# Patient Record
Sex: Male | Born: 1960
Health system: Southern US, Community
[De-identification: ages and names within clinical notes are randomized; demographics above are authoritative.]

## PROBLEM LIST (undated history)

## (undated) DIAGNOSIS — L729 Follicular cyst of the skin and subcutaneous tissue, unspecified: Secondary | ICD-10-CM

## (undated) DIAGNOSIS — R739 Hyperglycemia, unspecified: Secondary | ICD-10-CM

## (undated) DIAGNOSIS — Z720 Tobacco use: Secondary | ICD-10-CM

## (undated) DIAGNOSIS — I10 Essential (primary) hypertension: Secondary | ICD-10-CM

## (undated) DIAGNOSIS — G4730 Sleep apnea, unspecified: Secondary | ICD-10-CM

## (undated) DIAGNOSIS — Z8249 Family history of ischemic heart disease and other diseases of the circulatory system: Secondary | ICD-10-CM

## (undated) DIAGNOSIS — R972 Elevated prostate specific antigen [PSA]: Secondary | ICD-10-CM

## (undated) DIAGNOSIS — N529 Male erectile dysfunction, unspecified: Secondary | ICD-10-CM

## (undated) DIAGNOSIS — G4733 Obstructive sleep apnea (adult) (pediatric): Secondary | ICD-10-CM

## (undated) DIAGNOSIS — G4701 Insomnia due to medical condition: Secondary | ICD-10-CM

## (undated) HISTORY — DX: Sleep apnea, unspecified: G47.30

---
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
## 1987-11-27 HISTORY — PX: WISDOM TOOTH EXTRACTION: SHX21

## 2000-04-18 ENCOUNTER — Ambulatory Visit (HOSPITAL_COMMUNITY): Admission: RE | Admit: 2000-04-18 | Discharge: 2000-04-18 | Payer: Self-pay | Admitting: *Deleted

## 2006-04-25 DIAGNOSIS — G4701 Insomnia due to medical condition: Secondary | ICD-10-CM | POA: Insufficient documentation

## 2006-04-25 DIAGNOSIS — N529 Male erectile dysfunction, unspecified: Secondary | ICD-10-CM

## 2006-04-25 HISTORY — DX: Insomnia due to medical condition: G47.01

## 2006-04-25 HISTORY — DX: Male erectile dysfunction, unspecified: N52.9

## 2007-02-25 DIAGNOSIS — Z9989 Dependence on other enabling machines and devices: Secondary | ICD-10-CM | POA: Insufficient documentation

## 2007-02-25 DIAGNOSIS — G4733 Obstructive sleep apnea (adult) (pediatric): Secondary | ICD-10-CM

## 2007-02-25 HISTORY — DX: Dependence on other enabling machines and devices: Z99.89

## 2007-02-25 HISTORY — DX: Obstructive sleep apnea (adult) (pediatric): G47.33

## 2008-05-18 DIAGNOSIS — Z8249 Family history of ischemic heart disease and other diseases of the circulatory system: Secondary | ICD-10-CM

## 2008-05-18 DIAGNOSIS — I10 Essential (primary) hypertension: Secondary | ICD-10-CM | POA: Insufficient documentation

## 2008-05-18 HISTORY — DX: Essential (primary) hypertension: I10

## 2008-05-18 HISTORY — DX: Family history of ischemic heart disease and other diseases of the circulatory system: Z82.49

## 2011-05-20 ENCOUNTER — Ambulatory Visit: Payer: Self-pay | Admitting: Internal Medicine

## 2014-06-26 HISTORY — PX: DENTAL SURGERY: SHX609

## 2015-03-30 LAB — BASIC METABOLIC PANEL
BUN: 19 mg/dL (ref 4–21)
Creatinine: 1.5 mg/dL — AB (ref 0.6–1.3)
Glucose: 131 mg/dL
Potassium: 4.2 mmol/L (ref 3.4–5.3)
Sodium: 138 mmol/L (ref 137–147)

## 2015-03-30 LAB — LIPID PANEL
Cholesterol: 183 mg/dL (ref 0–200)
HDL: 42 mg/dL (ref 35–70)
LDL Cholesterol: 93 mg/dL
Triglycerides: 240 mg/dL — AB (ref 40–160)

## 2015-03-30 LAB — PSA: PSA: 4.2

## 2015-03-31 ENCOUNTER — Encounter: Payer: Self-pay | Admitting: Family Medicine

## 2015-04-04 NOTE — Telephone Encounter (Signed)
error 

## 2015-04-27 ENCOUNTER — Telehealth: Payer: Self-pay | Admitting: Family Medicine

## 2015-04-27 NOTE — Telephone Encounter (Signed)
Jerry Walsh with [facility] called to request sleep study and progress notes.  [phone]/HK

## 2015-04-27 NOTE — Telephone Encounter (Signed)
Please send requested information.

## 2015-04-29 NOTE — Telephone Encounter (Signed)
Per Dr Caryn Section we can send requested info/MJ

## 2015-04-29 NOTE — Telephone Encounter (Signed)
Faxed to Diane @[phone]

## 2015-05-09 ENCOUNTER — Telehealth: Payer: Self-pay | Admitting: Family Medicine

## 2015-05-09 NOTE — Telephone Encounter (Signed)
Pt is asking if we have rec'd paper work from [employer] for Dr Caryn Section signature to approve new cpap supplies and equipment.  CB#[phone]/MJ

## 2015-05-09 NOTE — Telephone Encounter (Signed)
Please advise 

## 2015-05-09 NOTE — Telephone Encounter (Signed)
I don't see an order for this in the EMR. Please call [facility] and have them resend order request. Thanks.

## 2015-05-10 NOTE — Telephone Encounter (Signed)
[facility] closed at 3 pm. Will call back tomorrow. [phone]

## 2015-05-12 ENCOUNTER — Encounter: Payer: Self-pay | Admitting: Family Medicine

## 2015-05-12 ENCOUNTER — Ambulatory Visit (INDEPENDENT_AMBULATORY_CARE_PROVIDER_SITE_OTHER): Payer: 59 | Admitting: Family Medicine

## 2015-05-12 VITALS — BP 130/80 | HR 100 | Temp 98.9°F | Resp 18 | Wt 225.4 lb

## 2015-05-12 DIAGNOSIS — B349 Viral infection, unspecified: Secondary | ICD-10-CM

## 2015-05-12 LAB — POCT INFLUENZA A/B
Influenza A, POC: NEGATIVE
Influenza B, POC: NEGATIVE

## 2015-05-12 MED ORDER — OSELTAMIVIR PHOSPHATE 75 MG PO CAPS: 75.0000 mg | ORAL_CAPSULE | Freq: Two times a day (BID) | ORAL | Status: DC

## 2015-05-12 NOTE — Progress Notes (Signed)
   Subjective:    Patient ID: Jerry Walsh, male    DOB: 05-01-1961, 54 y.o.   MRN: [id number]  HPI Cough  Patient Active Problem List   Diagnosis Date Noted  . Essential (primary) hypertension 05/18/2008  . Fam hx-ischem heart disease 05/18/2008  . Obstructive apnea 02/25/2007  . Insomnia due to medical condition 04/25/2006  . ED (erectile dysfunction) of organic origin 04/25/2006   History  Substance Use Topics  . Smoking status: Current Every Day Smoker -- 1.00 packs/day for 15 years    Types: Cigarettes  . Smokeless tobacco: Not on file     Comment: QUIT IN 2008, AND STARTED BACK  . Alcohol Use: 1.8 oz/week    3 Standard drinks or equivalent per week     Comment: MODERATE USE   Past Surgical History  Procedure Laterality Date  . Wisdom tooth extraction  1989  . Dental surgery  06/2014   Family History  Problem Relation Age of Onset  . Diabetes Mother   . Congestive Heart Failure Mother   . Heart disease Father   . Coronary artery disease Father   . Diabetes Father    Current Outpatient Prescriptions on File Prior to Visit  Medication Sig Dispense Refill  . amLODipine (NORVASC) 5 MG tablet Take 1 tablet by mouth daily.    Marland Kitchen aspirin 81 MG tablet Take 1 tablet by mouth daily.    . tadalafil (CIALIS) 20 MG tablet Take 1 tablet by mouth as needed. NO MORE THAN 1 A DAY    . valsartan-hydrochlorothiazide (DIOVAN-HCT) 160-12.5 MG per tablet Take 1 tablet by mouth daily.     No current facility-administered medications on file prior to visit.   No Known Allergies   Review of Systems  Constitutional: Positive for chills and fatigue.  HENT: Positive for congestion. Negative for ear pain, postnasal drip, rhinorrhea and sore throat.   Eyes: Negative.   Respiratory: Positive for cough. Negative for chest tightness and wheezing.   Cardiovascular: Negative.   Gastrointestinal: Negative for nausea, vomiting and diarrhea.       Decreased appetite  Genitourinary:  Negative.   Musculoskeletal: Positive for myalgias, back pain and arthralgias.  Skin: Negative.        Objective:   Physical Exam  Constitutional: He is oriented to person, place, and time. He appears well-developed and well-nourished.  HENT:  Head: Normocephalic and atraumatic.  Right Ear: External ear normal.  Left Ear: External ear normal.  Slightly cobblestoned posterior pharynx and white nasal mucus.  Eyes: EOM are normal. Pupils are equal, round, and reactive to light.  Bilateral arcus senilis  Neck: Normal range of motion. Neck supple.  Cardiovascular: Normal rate, regular rhythm and normal heart sounds.   Pulmonary/Chest: Effort normal and breath sounds normal.  Abdominal: Soft. Bowel sounds are normal.  Musculoskeletal: Normal range of motion.  Neurological: He is alert and oriented to person, place, and time.  Skin: He is diaphoretic.   BP 130/80 mmHg  Pulse 100  Temp(Src) 98.9 F (37.2 C) (Oral)  Resp 18  Wt 225 lb 6.4 oz (102.241 kg)  SpO2 97%        Assessment & Plan:  1. Viral illness Onset of cough, headache, chills and general malaise with decreased appetite. Influenza test negative. Treat empirically with Tamiflu. May continue TheraFlu for cough and other symptoms. Increase fluid intake and recheck if no better in 5 - 7 days. - POCT Influenza A/B

## 2015-05-13 ENCOUNTER — Ambulatory Visit: Payer: Self-pay | Admitting: Family Medicine

## 2015-05-13 NOTE — Telephone Encounter (Signed)
LMOVM for cb. ?

## 2015-05-17 NOTE — Telephone Encounter (Signed)
Jennifer from [facility] to refax order.

## 2015-05-17 NOTE — Telephone Encounter (Signed)
Anderson Malta confirmed she received fax. sd

## 2015-05-17 NOTE — Telephone Encounter (Signed)
CPAP order faxed to Jerry Walsh fax # [phone]. LMTCB to make sure she received fax. sd

## 2015-05-18 ENCOUNTER — Ambulatory Visit
Admission: RE | Admit: 2015-05-18 | Discharge: 2015-05-18 | Disposition: A | Discharge status: Home / Self Care | Payer: 59 | Source: Ambulatory Visit | Attending: Family Medicine | Admitting: Family Medicine

## 2015-05-18 ENCOUNTER — Ambulatory Visit (INDEPENDENT_AMBULATORY_CARE_PROVIDER_SITE_OTHER): Payer: 59 | Admitting: Family Medicine

## 2015-05-18 ENCOUNTER — Encounter: Payer: Self-pay | Admitting: Family Medicine

## 2015-05-18 VITALS — BP 126/82 | HR 76 | Temp 97.9°F | Resp 16 | Wt 226.0 lb

## 2015-05-18 DIAGNOSIS — R05 Cough: Secondary | ICD-10-CM | POA: Diagnosis not present

## 2015-05-18 DIAGNOSIS — R059 Cough, unspecified: Secondary | ICD-10-CM

## 2015-05-18 DIAGNOSIS — J069 Acute upper respiratory infection, unspecified: Secondary | ICD-10-CM

## 2015-05-18 DIAGNOSIS — Z720 Tobacco use: Secondary | ICD-10-CM

## 2015-05-18 HISTORY — DX: Tobacco use: Z72.0

## 2015-05-18 MED ORDER — HYDROCODONE-HOMATROPINE 5-1.5 MG/5ML PO SYRP: 5.0000 mL | ORAL_SOLUTION | Freq: Three times a day (TID) | ORAL | Status: DC | PRN

## 2015-05-18 MED ORDER — BUPROPION HCL ER (SR) 150 MG PO TB12: ORAL_TABLET | ORAL | Status: DC

## 2015-05-18 NOTE — Progress Notes (Signed)
   Subjective:    Patient ID: Jerry Walsh, male    DOB: 06/17/1961, 54 y.o.   MRN: [id number]  URI  This is a recurrent problem. The problem has been unchanged. There has been no fever. Associated symptoms include congestion, coughing, headaches and rhinorrhea. Pertinent negatives include no abdominal pain, chest pain, diarrhea, ear pain, nausea, plugged ear sensation, sinus pain, sneezing, sore throat, vomiting or wheezing. The treatment provided no relief.   He was seen 05-12-15 with cough and flu symptoms and treated with Tamiflu which he has completed. He feels slightly improved today. No more fevers or chills. Will be traveling to [place] for the next 2 weeks.   Review of Systems  Constitutional: Positive for fatigue. Negative for fever, chills, diaphoresis, activity change, appetite change and unexpected weight change.  HENT: Positive for congestion and rhinorrhea. Negative for ear discharge, ear pain, facial swelling, hearing loss, mouth sores, nosebleeds, postnasal drip, sinus pressure, sneezing, sore throat, tinnitus, trouble swallowing and voice change.   Eyes: Negative for photophobia, pain, discharge, redness, itching and visual disturbance.  Respiratory: Positive for cough. Negative for apnea, choking, chest tightness, shortness of breath, wheezing and stridor.   Cardiovascular: Negative for chest pain, palpitations and leg swelling.  Gastrointestinal: Negative for nausea, vomiting, abdominal pain, diarrhea, constipation, blood in stool, abdominal distention, anal bleeding and rectal pain.  Neurological: Positive for headaches. Negative for dizziness and light-headedness.       Objective:   Physical Exam BP 126/82 mmHg  Pulse 76  Temp(Src) 97.9 F (36.6 C) (Oral)  Resp 16  Wt 226 lb (102.513 kg)  SpO2 98%   General Appearance:    Alert, cooperative, no distress  HENT:   ENT exam normal, no neck nodes or sinus tenderness  Eyes:    PERRL, conjunctiva/corneas clear, EOM's  intact       Lungs:     Mild diffuse expiratory wheezes, no rales respirations unlabored  Heart:    Regular rate and rhythm  Neurologic:   Awake, alert, oriented x 3. No apparent focal neurological           defect.           Assessment & Plan:  1. Upper respiratory infection   2. Cough  - DG Chest 2 View; Future - HYDROcodone-homatropine (HYCODAN) 5-1.5 MG/5ML syrup; Take 5 mLs by mouth every 8 (eight) hours as needed for cough.  Dispense: 120 mL; Refill: 0  3. Tobacco abuse  - buPROPion (WELLBUTRIN SR) 150 MG 12 hr tablet; Daily for 3 days, then twice daily. Stop smoking after being on medication  Dispense: 60 tablet; Refill: 3

## 2015-05-19 ENCOUNTER — Telehealth: Payer: Self-pay | Admitting: *Deleted

## 2015-05-19 ENCOUNTER — Telehealth: Payer: Self-pay | Admitting: Family Medicine

## 2015-05-19 MED ORDER — LEVOFLOXACIN 500 MG PO TABS: 500.0000 mg | ORAL_TABLET | Freq: Every day | ORAL | Status: DC

## 2015-05-19 NOTE — Telephone Encounter (Signed)
Pt is requesting the results of a chest x-ray.  CB#[phone]/MJ

## 2015-05-19 NOTE — Telephone Encounter (Signed)
Patient notified of results.

## 2015-05-19 NOTE — Telephone Encounter (Signed)
-----   Message from Birdie Sons, MD sent at 05/18/2015  2:57 PM EDT ----- Please advise patient that there is no sign of pneumonia on chest xr, but i think he does have bronchitis. Please send in rx for levofloxacin 500mg  one tablet daily for 10 days. He should be fine to travel.

## 2015-05-19 NOTE — Telephone Encounter (Signed)
Rx sent to pharmacy   

## 2015-06-20 ENCOUNTER — Other Ambulatory Visit: Payer: Self-pay | Admitting: Family Medicine

## 2015-06-20 MED ORDER — VALSARTAN-HYDROCHLOROTHIAZIDE 160-12.5 MG PO TABS: 1.0000 | ORAL_TABLET | Freq: Every day | ORAL | Status: DC

## 2015-06-20 MED ORDER — AMLODIPINE BESYLATE 5 MG PO TABS: 5.0000 mg | ORAL_TABLET | Freq: Every day | ORAL | Status: DC

## 2015-06-20 NOTE — Telephone Encounter (Signed)
Pt contacted office for refill request on the following medications: amLODipine (NORVASC) 5 MG tablet & valsartan-hydrochlorothiazide (DIOVAN-HCT) 160-12.5 MG per tablet to [facility]. Pt stated that his mail order is sending the medication to him but he needs 10 days sent to local pharmacy to last until mail order comes in. I asked pt if we also needed to send a refill to mail order because we haven't refilled it since 11/20/14. Pt stated that his RX is still good and he has already request they send the medication. Pt stated that this was kind of an emergency because he was out of the medication. I advised that refills can take 24 to 48 hours. Thanks TNP

## 2015-06-20 NOTE — Telephone Encounter (Signed)
Pt informed, bb

## 2015-06-20 NOTE — Telephone Encounter (Signed)
Have sent 10 day supply valsartan-hct and amlodipine to [facility].

## 2015-07-27 ENCOUNTER — Ambulatory Visit: Payer: 59 | Admitting: Family Medicine

## 2015-07-27 ENCOUNTER — Telehealth: Payer: Self-pay | Admitting: *Deleted

## 2015-07-27 NOTE — Telephone Encounter (Signed)
Left message on pt's vm to reschedule appt.

## 2015-08-10 ENCOUNTER — Encounter: Payer: Self-pay | Admitting: Family Medicine

## 2015-08-10 ENCOUNTER — Ambulatory Visit (INDEPENDENT_AMBULATORY_CARE_PROVIDER_SITE_OTHER): Payer: 59 | Admitting: Family Medicine

## 2015-08-10 VITALS — BP 126/78 | HR 74 | Temp 98.6°F | Resp 16 | Ht 69.0 in | Wt 229.0 lb

## 2015-08-10 DIAGNOSIS — M779 Enthesopathy, unspecified: Secondary | ICD-10-CM

## 2015-08-10 DIAGNOSIS — R972 Elevated prostate specific antigen [PSA]: Secondary | ICD-10-CM

## 2015-08-10 DIAGNOSIS — M778 Other enthesopathies, not elsewhere classified: Secondary | ICD-10-CM

## 2015-08-10 DIAGNOSIS — M6588 Other synovitis and tenosynovitis, other site: Secondary | ICD-10-CM | POA: Diagnosis not present

## 2015-08-10 DIAGNOSIS — Z720 Tobacco use: Secondary | ICD-10-CM | POA: Diagnosis not present

## 2015-08-10 DIAGNOSIS — R739 Hyperglycemia, unspecified: Secondary | ICD-10-CM

## 2015-08-10 HISTORY — DX: Elevated prostate specific antigen (PSA): R97.20

## 2015-08-10 HISTORY — DX: Hyperglycemia, unspecified: R73.9

## 2015-08-10 MED ORDER — VARENICLINE TARTRATE 1 MG PO TABS: 1.0000 mg | ORAL_TABLET | Freq: Two times a day (BID) | ORAL | Status: DC

## 2015-08-10 MED ORDER — VARENICLINE TARTRATE 0.5 MG X 11 & 1 MG X 42 PO MISC: ORAL | Status: DC

## 2015-08-10 NOTE — Progress Notes (Signed)
Patient: Jerry Walsh Male    DOB: 12-02-1960   54 y.o.   MRN: [id number] Visit Date: 08/10/2015  Today's Provider: [name], MD   Chief Complaint  Patient presents with  . Hypertension    follow up  . Sleep Apnea    follow up  . Hyperglycemia    follow up  . Elevated PSA    follow up   Subjective:    HPI  Hypertension, follow-up:  BP Readings from Last 3 Encounters:  08/10/15 126/78  05/18/15 126/82  05/12/15 130/80    He was last seen for hypertension 4 months ago.  BP at that visit was  126/82. Management since that visit includes  none. He reports good compliance with treatment. He is not having side effects.  He is exercising. He is not adherent to low salt diet.   Outside blood pressures are not being checked. He is experiencing none.  Patient denies chest pain, chest pressure/discomfort, claudication, dyspnea, exertional chest pressure/discomfort, fatigue, irregular heart beat, lower extremity edema, near-syncope, orthopnea, palpitations, paroxysmal nocturnal dyspnea, syncope and tachypnea.   Cardiovascular risk factors include hypertension, male gender and smoking/ tobacco exposure.  Use of agents associated with hypertension: NSAIDS.     Weight trend: increasing steadily Wt Readings from Last 3 Encounters:  08/10/15 229 lb (103.874 kg)  05/18/15 226 lb (102.513 kg)  05/12/15 225 lb 6.4 oz (102.241 kg)    Current diet: in general, an "unhealthy" diet  ------------------------------------------------------------------------   Hyperglycemia, Follow-up:   No results found for: HGBA1C, GLUCOSE  Last seen for for this 4 months ago.  Management since then includes  Advising patient to cut back on sweets and starchy foods. Current symptoms include none and have been stable.  Weight trend: increasing steadily Prior visit with dietician: no Current diet: in general, an "unhealthy" diet Current exercise: none  Pertinent Labs:      Component Value Date/Time   CHOL 183 03/30/2015   TRIG 240* 03/30/2015   CREATININE 1.5* 03/30/2015    Wt Readings from Last 3 Encounters:  08/10/15 229 lb (103.874 kg)  05/18/15 226 lb (102.513 kg)  05/12/15 225 lb 6.4 oz (102.241 kg)       Follow up Elevated PSA: Last office visit was 4 months ago. Labs were checked showing his PSA was elevated 4.2.    Follow up on Obstructive Apnea: Last office visit was 4 months ago when new CPAP supplies were ordered. He states he has had  New supplies for about 6 weeks and is doing very well. He is using CPAP every night and has been much less fatigued and not feeling drowsy or falling asleep during the day. He feels current setting are making a big improvement and wishes to continue unchanged.   Smoking Cessation Was prescribed bproprion earlier this year which he took for about a month. He states he really felt his cravings go away, but developed severe itching and some whelps after he had taken medication for a couple of weeks. Itching resolved after stopping medication, but craving to smoke have returned and he wishes to try another medication.    Allergies  Allergen Reactions  . Bupropion Itching   Previous Medications   AMLODIPINE (NORVASC) 5 MG TABLET    Take 1 tablet (5 mg total) by mouth daily.   ASPIRIN 81 MG TABLET    Take 1 tablet by mouth daily.   IBUPROFEN (ADVIL,MOTRIN) 200 MG TABLET    Take  200 mg by mouth every 6 (six) hours as needed.   TADALAFIL (CIALIS) 20 MG TABLET    Take 1 tablet by mouth as needed. NO MORE THAN 1 A DAY   VALSARTAN-HYDROCHLOROTHIAZIDE (DIOVAN-HCT) 160-12.5 MG PER TABLET    Take 1 tablet by mouth daily.    Review of Systems  Constitutional: Negative for fever, chills and appetite change.  Respiratory: Negative for chest tightness, shortness of breath and wheezing.   Cardiovascular: Negative for chest pain and palpitations.  Gastrointestinal: Negative for nausea, vomiting and abdominal pain.   Musculoskeletal: Positive for arthralgias (right thumb ).    Social History  Substance Use Topics  . Smoking status: Current Every Day Smoker -- 1.00 packs/day for 15 years    Types: Cigarettes  . Smokeless tobacco: Not on file     Comment: QUIT IN 2008, AND STARTED BACK  . Alcohol Use: 1.8 oz/week    3 Standard drinks or equivalent per week     Comment: MODERATE USE   Objective:   BP 126/78 mmHg  Pulse 74  Temp(Src) 98.6 F (37 C) (Oral)  Resp 16  Ht 5\' 9"  (1.753 m)  Wt 229 lb (103.874 kg)  BMI 33.80 kg/m2  SpO2 97%  Physical Exam  General appearance: alert, well developed, well nourished, cooperative and in no distress Head: Normocephalic, without obvious abnormality, atraumatic Lungs: Respirations even and unlabored Extremities: No gross deformities Skin: Skin color, texture, turgor normal. No rashes seen  Psych: Appropriate mood and affect. Neurologic: Mental status: Alert, oriented to person, place, and time, thought content appropriate. MS: Pain with active flexion of right thumb, slightly tender over flexors. Mildly swollen.     Assessment & Plan:     1. Elevated PSA  - PSA  2. Hyperglycemia  - Hemoglobin A1c  3. Tobacco abuse Had to stop bupropion due to itching.  - varenicline (CHANTIX STARTING MONTH PAK) 0.5 MG X 11 & 1 MG X 42 tablet; Take one 0.5 mg tablet by mouth once daily for 3 days, then increase to one 0.5 mg tablet twice daily for 4 days, then increase to one 1 mg tablet twice daily.  Dispense: 53 tablet; Refill: 0 - varenicline (CHANTIX CONTINUING MONTH PAK) 1 MG tablet; Take 1 tablet (1 mg total) by mouth 2 (two) times daily.  Dispense: 60 tablet; Refill: 3  4. Thumb tendonitis He did notice some improvement when he starting using thumb brace.   Patient Instructions  Continue using thumb brace. Call for orthopedic referral if not better in 2 weeks.         [name], MD  [facility]

## 2015-08-10 NOTE — Patient Instructions (Signed)
Continue using thumb brace. Call for orthopedic referral if not better in 2 weeks.

## 2015-11-22 ENCOUNTER — Other Ambulatory Visit: Payer: Self-pay | Admitting: Family Medicine

## 2016-02-08 ENCOUNTER — Other Ambulatory Visit: Payer: Self-pay | Admitting: Family Medicine

## 2016-04-12 ENCOUNTER — Other Ambulatory Visit: Payer: Self-pay | Admitting: Family Medicine

## 2016-11-05 ENCOUNTER — Other Ambulatory Visit: Payer: Self-pay | Admitting: Family Medicine

## 2016-11-05 DIAGNOSIS — I10 Essential (primary) hypertension: Secondary | ICD-10-CM

## 2016-11-05 MED ORDER — AMLODIPINE BESYLATE 5 MG PO TABS: 5.0000 mg | ORAL_TABLET | Freq: Every day | ORAL | 0 refills | Status: DC

## 2016-11-05 MED ORDER — VALSARTAN-HYDROCHLOROTHIAZIDE 160-12.5 MG PO TABS: 1.0000 | ORAL_TABLET | Freq: Every day | ORAL | 0 refills | Status: DC

## 2016-11-05 NOTE — Telephone Encounter (Signed)
I spoke with patient and advised him that the number he gave was the number to [facility] office. Patient provided me with another number for [facility] pharmacy in [place]:  [phone]. I called this number and was on hold for over 5 minutes and was then told by the pharmacy tech that the pharmacist was helping a patient and there would be a bit of a wait before he could get to the phone. I then phoned prescriptions in leaving a detailed message on the Pharmacist voice message system. I called patient and advised him of this.

## 2016-11-05 NOTE — Telephone Encounter (Signed)
Tried calling patient to confirm the pharmacy. Tried calling into the pharmacy listed below, and the representative  reports that they dont have a pharmacy at that location. She reports that they do have one on [address] in [place], [place] and gave me a number to call, but I wanted to make sure that this is the correct pharmacy before calling them in. Left message for the patient to call back.  The number for the pharmacy is [phone]. Thanks.

## 2016-11-05 NOTE — Telephone Encounter (Signed)
Please advise 

## 2016-11-05 NOTE — Telephone Encounter (Signed)
Pt is calling to see when the Rx will be called in.  CB# [phone]

## 2016-11-05 NOTE — Telephone Encounter (Signed)
Pt called from [place] needing a refill on his   amLODipine (NORVASC) 5 MG tablet  valsartan-hydrochlorothiazide (DIOVAN-HCT) 160-12.5 MG tablet  The name of the pharmacy is  [facility] [address] [place] [address]  [phone]  He [place] and [place] his meds.  Just needs 1 week.  His call back [phone]  Thanks Con Memos

## 2016-11-05 NOTE — Telephone Encounter (Signed)
OK to send or call into pharmacy.

## 2017-01-03 DIAGNOSIS — M545 Low back pain: Secondary | ICD-10-CM | POA: Diagnosis not present

## 2017-03-26 ENCOUNTER — Other Ambulatory Visit: Payer: Self-pay | Admitting: Family Medicine

## 2017-03-26 DIAGNOSIS — I10 Essential (primary) hypertension: Secondary | ICD-10-CM

## 2017-05-01 ENCOUNTER — Ambulatory Visit (INDEPENDENT_AMBULATORY_CARE_PROVIDER_SITE_OTHER): Payer: 59 | Admitting: Family Medicine

## 2017-05-01 ENCOUNTER — Encounter: Payer: Self-pay | Admitting: Family Medicine

## 2017-05-01 VITALS — BP 110/64 | HR 78 | Temp 98.1°F | Resp 16 | Ht 69.0 in | Wt 231.0 lb

## 2017-05-01 DIAGNOSIS — Z0000 Encounter for general adult medical examination without abnormal findings: Secondary | ICD-10-CM | POA: Diagnosis not present

## 2017-05-01 DIAGNOSIS — Z720 Tobacco use: Secondary | ICD-10-CM

## 2017-05-01 DIAGNOSIS — L729 Follicular cyst of the skin and subcutaneous tissue, unspecified: Secondary | ICD-10-CM | POA: Diagnosis not present

## 2017-05-01 DIAGNOSIS — I10 Essential (primary) hypertension: Secondary | ICD-10-CM | POA: Diagnosis not present

## 2017-05-01 DIAGNOSIS — Z1159 Encounter for screening for other viral diseases: Secondary | ICD-10-CM | POA: Diagnosis not present

## 2017-05-01 DIAGNOSIS — Z1211 Encounter for screening for malignant neoplasm of colon: Secondary | ICD-10-CM

## 2017-05-01 DIAGNOSIS — Z9989 Dependence on other enabling machines and devices: Secondary | ICD-10-CM

## 2017-05-01 DIAGNOSIS — Z125 Encounter for screening for malignant neoplasm of prostate: Secondary | ICD-10-CM | POA: Diagnosis not present

## 2017-05-01 DIAGNOSIS — R739 Hyperglycemia, unspecified: Secondary | ICD-10-CM | POA: Diagnosis not present

## 2017-05-01 DIAGNOSIS — G4733 Obstructive sleep apnea (adult) (pediatric): Secondary | ICD-10-CM | POA: Diagnosis not present

## 2017-05-01 HISTORY — DX: Follicular cyst of the skin and subcutaneous tissue, unspecified: L72.9

## 2017-05-01 NOTE — Patient Instructions (Signed)
Steps to Quit Smoking Smoking tobacco can be bad for your health. It can also affect almost every organ in your body. Smoking puts you and people around you at risk for many serious long-lasting (chronic) diseases. Quitting smoking is hard, but it is one of the best things that you can do for your health. It is never too late to quit. What are the benefits of quitting smoking? When you quit smoking, you lower your risk for getting serious diseases and conditions. They can include:  Lung cancer or lung disease.  Heart disease.  Stroke.  Heart attack.  Not being able to have children (infertility).  Weak bones (osteoporosis) and broken bones (fractures).  If you have coughing, wheezing, and shortness of breath, those symptoms may get better when you quit. You may also get sick less often. If you are pregnant, quitting smoking can help to lower your chances of having a baby of low birth weight. What can I do to help me quit smoking? Talk with your doctor about what can help you quit smoking. Some things you can do (strategies) include:  Quitting smoking totally, instead of slowly cutting back how much you smoke over a period of time.  Going to in-person counseling. You are more likely to quit if you go to many counseling sessions.  Using resources and support systems, such as: ? Online chats with a counselor. ? Phone quitlines. ? Printed self-help materials. ? Support groups or group counseling. ? Text messaging programs. ? Mobile phone apps or applications.  Taking medicines. Some of these medicines may have nicotine in them. If you are pregnant or breastfeeding, do not take any medicines to quit smoking unless your doctor says it is okay. Talk with your doctor about counseling or other things that can help you.  Talk with your doctor about using more than one strategy at the same time, such as taking medicines while you are also going to in-person counseling. This can help make  quitting easier. What things can I do to make it easier to quit? Quitting smoking might feel very hard at first, but there is a lot that you can do to make it easier. Take these steps:  Talk to your family and friends. Ask them to support and encourage you.  Call phone quitlines, reach out to support groups, or work with a counselor.  Ask people who smoke to not smoke around you.  Avoid places that make you want (trigger) to smoke, such as: ? Bars. ? Parties. ? Smoke-break areas at work.  Spend time with people who do not smoke.  Lower the stress in your life. Stress can make you want to smoke. Try these things to help your stress: ? Getting regular exercise. ? Deep-breathing exercises. ? Yoga. ? Meditating. ? Doing a body scan. To do this, close your eyes, focus on one area of your body at a time from head to toe, and notice which parts of your body are tense. Try to relax the muscles in those areas.  Download or buy apps on your mobile phone or tablet that can help you stick to your quit plan. There are many free apps, such as QuitGuide from the CDC (Centers for Disease Control and Prevention). You can find more support from smokefree.gov and other websites.  This information is not intended to replace advice given to you by your health care provider. Make sure you discuss any questions you have with your health care provider. Document Released: 09/08/2009 Document   Revised: 07/10/2016 Document Reviewed: 03/29/2015 Elsevier Interactive Patient Education  2018 [employer]vier Inc.  

## 2017-05-01 NOTE — Progress Notes (Signed)
Patient: Jerry Walsh, Male    DOB: 12-05-1960, 56 y.o.   MRN: [id number] Visit Date: 05/01/2017  Today's Provider: [name], MD   Chief Complaint  Patient presents with  . Annual Exam  . Hypertension   Subjective:    Annual physical exam JOFFREY Walsh is a 56 y.o. male who presents today for health maintenance and complete physical. He feels well. He reports exercising yes/some. He reports he is sleeping well.  Colonoscopy-  Tdap- 05/18/2008.    Hypertension, follow-up:  BP Readings from Last 3 Encounters:  05/01/17 110/64  08/10/15 126/78  05/18/15 126/82    He was last seen for hypertension 2 years ago.  BP at that visit was 126/82. Management since that visit includes no changes. He reports good compliance with treatment. He is not having side effects. none He is exercising. He is adherent to low salt diet.   Outside blood pressures are not checking. He is experiencing none.  Patient denies none.   Cardiovascular risk factors include none.  Use of agents associated with hypertension: none.     Weight trend: stable Wt Readings from Last 3 Encounters:  05/01/17 231 lb (104.8 kg)  08/10/15 229 lb (103.9 kg)  05/18/15 226 lb (102.5 kg)    Current diet: well balanced    Hyperglycemia, Follow-up:   No results found for: HGBA1C, GLUCOSE  Last seen for for this 2 years ago.  Management since then includes no changes. Current symptoms include none and have been unchanged.  Weight trend: stable Prior visit with dietician: no Current diet: well balanced Current exercise: gym  Pertinent Labs:    Component Value Date/Time   CHOL 183 03/30/2015   TRIG 240 (A) 03/30/2015   CREATININE 1.5 (A) 03/30/2015    Wt Readings from Last 3 Encounters:  05/01/17 231 lb (104.8 kg)  08/10/15 229 lb (103.9 kg)  05/18/15 226 lb (102.5 kg)    Is using CPAP most nights and notices big difference in energy level and concentration when he uses  it. Is well tolerating.   Review of Systems  Constitutional: Negative.   HENT: Positive for postnasal drip and sneezing.   Eyes: Negative.   Respiratory: Positive for apnea.   Cardiovascular: Negative.   Gastrointestinal: Negative.   Endocrine: Negative.   Genitourinary: Negative.   Musculoskeletal: Positive for back pain.  Skin: Negative.   Allergic/Immunologic: Negative.   Neurological: Negative.   Hematological: Negative.   Psychiatric/Behavioral: Negative.     Social History      He  reports that he has been smoking Cigarettes.  He has a 15.00 pack-year smoking history. He uses smokeless tobacco. He reports that he drinks about 1.8 oz of alcohol per week . He reports that he does not use drugs.       Social History   Social History  . Marital status: Married    Spouse name: N/A  . Number of children: N/A  . Years of education: N/A   Social History Main Topics  . Smoking status: Current Every Day Smoker    Packs/day: 1.00    Years: 15.00    Types: Cigarettes  . Smokeless tobacco: Current User     Comment: QUIT IN 2008, AND STARTED BACK  . Alcohol use 1.8 oz/week    3 Standard drinks or equivalent per week     Comment: MODERATE USE  . Drug use: No  . Sexual activity: Not Asked   Other Topics  Concern  . None   Social History Narrative  . None    History reviewed. No pertinent past medical history.   Patient Active Problem List   Diagnosis Date Noted  . Elevated PSA 08/10/2015  . Hyperglycemia 08/10/2015  . Tobacco abuse 05/18/2015  . Essential (primary) hypertension 05/18/2008  . Fam hx-ischem heart disease 05/18/2008  . Obstructive apnea 02/25/2007  . Insomnia due to medical condition 04/25/2006  . ED (erectile dysfunction) of organic origin 04/25/2006    Past Surgical History:  Procedure Laterality Date  . DENTAL SURGERY  06/2014  . WISDOM TOOTH EXTRACTION  1989    Family History        Family Status  Relation Status  . Mother Alive  .  Father Alive  . Brother Alive  . Brother Deceased        His family history includes Congestive Heart Failure in his mother; Coronary artery disease in his father; Diabetes in his father and mother; Heart disease in his father.     Allergies  Allergen Reactions  . Bupropion Itching     Current Outpatient Prescriptions:  .  amLODipine (NORVASC) 5 MG tablet, TAKE 1 TABLET BY MOUTH  DAILY, Disp: 90 tablet, Rfl: 0 .  aspirin 81 MG tablet, Take 1 tablet by mouth daily., Disp: , Rfl:  .  CIALIS 20 MG tablet, Take 1 tablet by mouth as  needed no more than 1  tablet in a day, Disp: 9 tablet, Rfl: 5 .  ibuprofen (ADVIL,MOTRIN) 200 MG tablet, Take 200 mg by mouth every 6 (six) hours as needed., Disp: , Rfl:  .  valsartan-hydrochlorothiazide (DIOVAN-HCT) 160-12.5 MG tablet, TAKE 1 TABLET BY MOUTH  DAILY, Disp: 90 tablet, Rfl: 0   Patient Care Team: Birdie Sons, MD as PCP - General (Family Medicine)      Objective:   Vitals: BP 110/64 (BP Location: Right Arm, Patient Position: Sitting, Cuff Size: Large)   Pulse 78   Temp 98.1 F (36.7 C) (Oral)   Resp 16   Ht 5\' 9"  (1.753 m)   Wt 231 lb (104.8 kg)   SpO2 97%   BMI 34.11 kg/m    Vitals:   05/01/17 0912  BP: 110/64  Pulse: 78  Resp: 16  Temp: 98.1 F (36.7 C)  TempSrc: Oral  SpO2: 97%  Weight: 231 lb (104.8 kg)  Height: 5\' 9"  (1.753 m)     Physical Exam   General Appearance:    Alert, cooperative, no distress, appears stated age  Head:    Normocephalic, without obvious abnormality, atraumatic  Eyes:    PERRL, conjunctiva/corneas clear, EOM's intact, fundi    benign, both eyes       Ears:    Normal TM's and external ear canals, both ears  Nose:   Nares normal, septum midline, mucosa normal, no drainage   or sinus tenderness  Throat:   Lips, mucosa, and tongue normal; teeth and gums normal  Neck:   Supple, symmetrical, trachea midline, no adenopathy;       thyroid:  No enlargement/tenderness/nodules; no carotid    bruit or JVD  Back:     Symmetric, no curvature, ROM normal, no CVA tenderness  Lungs:     Clear to auscultation bilaterally, respirations unlabored  Chest wall:    No tenderness or deformity  Heart:    Regular rate and rhythm, S1 and S2 normal, no murmur, rub   or gallop  Abdomen:     Soft, non-tender,  bowel sounds active all four quadrants,    no masses, no organomegaly  Genitalia:    deferred  Rectal:    deferred  Extremities:   Extremities normal, atraumatic, no cyanosis or edema  Pulses:   2+ and symmetric all extremities  Skin:   Skin color, texture, turgor normal, no rashes. Firm slightly tender pea sized nodule vertex of scalp left of midline. No erythema.   Lymph nodes:   Cervical, supraclavicular, and axillary nodes normal  Neurologic:   CNII-XII intact. Normal strength, sensation and reflexes      throughout    Depression Screen PHQ 2/9 Scores 05/01/2017  PHQ - 2 Score 0  PHQ- 9 Score 0      Assessment & Plan:     Routine Health Maintenance and Physical Exam  Exercise Activities and Dietary recommendations Goals    None      Immunization History  Administered Date(s) Administered  . Tdap 05/18/2008, 07/04/2010    Health Maintenance  Topic Date Due  . Hepatitis C Screening  [birth year]  . HIV Screening  04/10/1975  . COLONOSCOPY  04/09/2010  . INFLUENZA VACCINE  06/26/2017  . TETANUS/TDAP  07/04/2020     Discussed health benefits of physical activity, and encouraged him to engage in regular exercise appropriate for his age and condition.    -------------------------------------------------------------------- 1. Annual physical exam Generally doing well.  - Comprehensive metabolic panel - Lipid panel  2. Essential (primary) hypertension Well controlled.  Continue current medications.   - EKG 12-Lead  3. OSA on CPAP Consistently using CPAP with significant benefit.   4. Tobacco abuse Has quit off and on over the years and has not accumulated 30  pack years of cigarette use. Counseled benefits of smoking cessation.   5. Prostate cancer screening  - PSA  6. Need for hepatitis C screening test  - Hepatitis C antibody  7. Colon cancer screening  - Ambulatory referral to Gastroenterology  8. Hyperglycemia  - Hemoglobin A1c  9. Scalp cyst  - Ambulatory referral to Dermatology    [name], MD  [facility]

## 2017-05-02 LAB — COMPREHENSIVE METABOLIC PANEL
ALT: 27 IU/L (ref 0–44)
AST: 24 IU/L (ref 0–40)
Albumin/Globulin Ratio: 1.8 (ref 1.2–2.2)
Albumin: 4.4 g/dL (ref 3.5–5.5)
Alkaline Phosphatase: 73 IU/L (ref 39–117)
BUN/Creatinine Ratio: 13 (ref 9–20)
BUN: 17 mg/dL (ref 6–24)
Bilirubin Total: 0.7 mg/dL (ref 0.0–1.2)
CO2: 26 mmol/L (ref 18–29)
Calcium: 9.7 mg/dL (ref 8.7–10.2)
Chloride: 100 mmol/L (ref 96–106)
Creatinine, Ser: 1.34 mg/dL — ABNORMAL HIGH (ref 0.76–1.27)
GFR calc Af Amer: 68 mL/min/{1.73_m2} (ref >59)
GFR calc non Af Amer: 58 mL/min/{1.73_m2} — ABNORMAL LOW (ref >59)
Globulin, Total: 2.5 g/dL (ref 1.5–4.5)
Glucose: 118 mg/dL — ABNORMAL HIGH (ref 65–99)
Potassium: 4.1 mmol/L (ref 3.5–5.2)
Sodium: 140 mmol/L (ref 134–144)
Total Protein: 6.9 g/dL (ref 6.0–8.5)

## 2017-05-02 LAB — LIPID PANEL
Chol/HDL Ratio: 3.9 ratio (ref 0.0–5.0)
Cholesterol, Total: 185 mg/dL (ref 100–199)
HDL: 47 mg/dL (ref >39)
LDL Calculated: 108 mg/dL — ABNORMAL HIGH (ref 0–99)
Triglycerides: 150 mg/dL — ABNORMAL HIGH (ref 0–149)
VLDL Cholesterol Cal: 30 mg/dL (ref 5–40)

## 2017-05-02 LAB — HEMOGLOBIN A1C
Est. average glucose Bld gHb Est-mCnc: 123 mg/dL
Hgb A1c MFr Bld: 5.9 % — ABNORMAL HIGH (ref 4.8–5.6)

## 2017-05-02 LAB — PSA: Prostate Specific Ag, Serum: 3.2 ng/mL (ref 0.0–4.0)

## 2017-05-02 LAB — HEPATITIS C ANTIBODY: Hep C Virus Ab: <0.1 s/co ratio (ref 0.0–0.9)

## 2017-05-08 DIAGNOSIS — L720 Epidermal cyst: Secondary | ICD-10-CM | POA: Diagnosis not present

## 2017-05-09 DIAGNOSIS — L729 Follicular cyst of the skin and subcutaneous tissue, unspecified: Secondary | ICD-10-CM | POA: Diagnosis not present

## 2017-05-09 DIAGNOSIS — L720 Epidermal cyst: Secondary | ICD-10-CM | POA: Diagnosis not present

## 2017-05-21 ENCOUNTER — Other Ambulatory Visit: Payer: Self-pay

## 2017-05-21 ENCOUNTER — Telehealth: Payer: Self-pay

## 2017-05-21 DIAGNOSIS — Z1211 Encounter for screening for malignant neoplasm of colon: Secondary | ICD-10-CM

## 2017-05-21 NOTE — Telephone Encounter (Signed)
Gastroenterology Pre-Procedure Review  Request Date:  Requesting Physician: Dr.   PATIENT REVIEW QUESTIONS: The patient responded to the following health history questions as indicated:    1. Are you having any GI issues? no 2. Do you have a personal history of Polyps? no 3. Do you have a family history of Colon Cancer or Polyps? no 4. Diabetes Mellitus? no 5. Joint replacements in the past 12 months?no 6. Major health problems in the past 3 months?no 7. Any artificial heart valves, MVP, or defibrillator?no    MEDICATIONS & ALLERGIES:    Patient reports the following regarding taking any anticoagulation/antiplatelet therapy:   Plavix, Coumadin, Eliquis, Xarelto, Lovenox, Pradaxa, Brilinta, or Effient? no Aspirin? yes (ASA 81mg )  Patient confirms/reports the following medications:  Current Outpatient Prescriptions  Medication Sig Dispense Refill  . amLODipine (NORVASC) 5 MG tablet TAKE 1 TABLET BY MOUTH  DAILY 90 tablet 0  . aspirin 81 MG tablet Take 1 tablet by mouth daily.    Marland Kitchen CIALIS 20 MG tablet Take 1 tablet by mouth as  needed no more than 1  tablet in a day 9 tablet 5  . ibuprofen (ADVIL,MOTRIN) 200 MG tablet Take 200 mg by mouth every 6 (six) hours as needed.    . valsartan-hydrochlorothiazide (DIOVAN-HCT) 160-12.5 MG tablet TAKE 1 TABLET BY MOUTH  DAILY 90 tablet 0   No current facility-administered medications for this visit.     Patient confirms/reports the following allergies:  Allergies  Allergen Reactions  . Bupropion Itching    No orders of the defined types were placed in this encounter.   AUTHORIZATION INFORMATION Primary Insurance: 1D#: Group #:  Secondary Insurance: 1D#: Group #:  SCHEDULE INFORMATION: Date: 06/27/17 Time: Location: [place]

## 2017-05-24 DIAGNOSIS — L905 Scar conditions and fibrosis of skin: Secondary | ICD-10-CM | POA: Diagnosis not present

## 2017-06-14 ENCOUNTER — Other Ambulatory Visit: Payer: Self-pay | Admitting: Family Medicine

## 2017-06-14 DIAGNOSIS — I10 Essential (primary) hypertension: Secondary | ICD-10-CM

## 2017-06-19 ENCOUNTER — Telehealth: Payer: Self-pay | Admitting: Gastroenterology

## 2017-06-19 NOTE — Telephone Encounter (Signed)
Patient needs to r/s his procedure due to his wife having knee surgery.

## 2017-06-20 NOTE — Telephone Encounter (Signed)
LVM for pt to contact office to reschedule colonoscopy from 06/27/17 with Dr. Vicente Males due to wifes surgery.

## 2017-06-27 ENCOUNTER — Ambulatory Visit: Admit: 2017-06-27 | Payer: 59 | Admitting: Gastroenterology

## 2017-06-27 SURGERY — COLONOSCOPY WITH PROPOFOL
Anesthesia: General

## 2017-08-15 ENCOUNTER — Other Ambulatory Visit: Payer: Self-pay | Admitting: Family Medicine

## 2017-08-15 DIAGNOSIS — I10 Essential (primary) hypertension: Secondary | ICD-10-CM

## 2017-08-15 MED ORDER — VALSARTAN-HYDROCHLOROTHIAZIDE 160-12.5 MG PO TABS: 1.0000 | ORAL_TABLET | Freq: Every day | ORAL | 0 refills | Status: DC

## 2017-08-15 MED ORDER — AMLODIPINE BESYLATE 5 MG PO TABS: 5.0000 mg | ORAL_TABLET | Freq: Every day | ORAL | 0 refills | Status: DC

## 2017-08-15 NOTE — Telephone Encounter (Signed)
Pt needs 10 days worth his heart medications.  He is out and expecting mail order that hasn't came in yet.  Amlodipine 5 mg Valsartan 160-12.5  [facility]  [address]   [employer]

## 2017-08-15 NOTE — Telephone Encounter (Signed)
Dr. Caryn Section, do we need to change Valsartan to something else? He hasn't received his mail order shipment and is requesting a 10 day supply of Valsartan and Amlodipine to be sent to the local pharmacy

## 2017-10-23 ENCOUNTER — Other Ambulatory Visit: Payer: Self-pay | Admitting: Family Medicine

## 2017-12-27 DIAGNOSIS — J101 Influenza due to other identified influenza virus with other respiratory manifestations: Secondary | ICD-10-CM | POA: Diagnosis not present

## 2018-07-31 ENCOUNTER — Other Ambulatory Visit: Payer: Self-pay | Admitting: Family Medicine

## 2018-08-15 DIAGNOSIS — L720 Epidermal cyst: Secondary | ICD-10-CM | POA: Diagnosis not present

## 2018-09-22 DIAGNOSIS — M545 Low back pain: Secondary | ICD-10-CM | POA: Diagnosis not present

## 2018-09-25 ENCOUNTER — Other Ambulatory Visit: Payer: Self-pay | Admitting: Family Medicine

## 2018-10-27 ENCOUNTER — Other Ambulatory Visit: Payer: Self-pay | Admitting: Family Medicine

## 2018-10-27 NOTE — Telephone Encounter (Signed)
[facility] faxed refill request for the following medications:  CIALIS 20 MG tablet  Please advise.

## 2018-10-28 MED ORDER — TADALAFIL 20 MG PO TABS: ORAL_TABLET | ORAL | 4 refills | Status: DC

## 2019-01-07 ENCOUNTER — Ambulatory Visit (INDEPENDENT_AMBULATORY_CARE_PROVIDER_SITE_OTHER): Payer: 59 | Admitting: Family Medicine

## 2019-01-07 ENCOUNTER — Encounter: Payer: Self-pay | Admitting: Family Medicine

## 2019-01-07 VITALS — BP 108/66 | HR 76 | Temp 98.3°F | Resp 16 | Ht 69.0 in | Wt 221.0 lb

## 2019-01-07 DIAGNOSIS — Z720 Tobacco use: Secondary | ICD-10-CM | POA: Diagnosis not present

## 2019-01-07 DIAGNOSIS — Z0000 Encounter for general adult medical examination without abnormal findings: Secondary | ICD-10-CM | POA: Diagnosis not present

## 2019-01-07 DIAGNOSIS — Z1211 Encounter for screening for malignant neoplasm of colon: Secondary | ICD-10-CM

## 2019-01-07 DIAGNOSIS — R739 Hyperglycemia, unspecified: Secondary | ICD-10-CM

## 2019-01-07 DIAGNOSIS — N529 Male erectile dysfunction, unspecified: Secondary | ICD-10-CM

## 2019-01-07 DIAGNOSIS — Z9989 Dependence on other enabling machines and devices: Secondary | ICD-10-CM

## 2019-01-07 DIAGNOSIS — I10 Essential (primary) hypertension: Secondary | ICD-10-CM

## 2019-01-07 DIAGNOSIS — G4733 Obstructive sleep apnea (adult) (pediatric): Secondary | ICD-10-CM | POA: Diagnosis not present

## 2019-01-07 DIAGNOSIS — Z23 Encounter for immunization: Secondary | ICD-10-CM

## 2019-01-07 DIAGNOSIS — Z125 Encounter for screening for malignant neoplasm of prostate: Secondary | ICD-10-CM

## 2019-01-07 MED ORDER — VARENICLINE TARTRATE 0.5 MG X 11 & 1 MG X 42 PO MISC: ORAL | 0 refills | Status: AC

## 2019-01-07 MED ORDER — TADALAFIL 20 MG PO TABS: ORAL_TABLET | ORAL | 4 refills | Status: DC

## 2019-01-07 NOTE — Progress Notes (Signed)
Patient: Jerry Walsh, Male    DOB: 1961-03-24, 58 y.o.   MRN: [id number] Visit Date: 01/07/2019  Today's Provider: [name], MD   Chief Complaint  Patient presents with  . Annual Exam   Subjective:     Annual physical exam Jerry Walsh is a 58 y.o. male who presents today for health maintenance and complete physical. He feels well. He reports exercising some. He reports he is sleeping well.  -----------------------------------------------------------------  Hypertension, follow-up:  BP Readings from Last 3 Encounters:  01/07/19 108/66  05/01/17 110/64  08/10/15 126/78    He was last seen for hypertension 18 months ago.  BP at that visit was well controlled. Management since that visit includes continue same medications. Marland Kitchen He reports good compliance with treatment. He is not having side effects.  He is exercising. He is adherent to low salt diet.      Weight trend: fluctuating a bit Wt Readings from Last 3 Encounters:  01/07/19 221 lb (100.2 kg)  05/01/17 231 lb (104.8 kg)  08/10/15 229 lb (103.9 kg)    Current diet: in general, a "healthy" diet    ------------------------------------------------------------------------   Follow up OSA He reports he continues to use his CPAP every day with significant improved energy and wakefulness.   Review of Systems  Constitutional: Negative.   HENT: Positive for congestion. Negative for dental problem, drooling, ear discharge, ear pain, facial swelling, hearing loss, mouth sores, nosebleeds, postnasal drip, rhinorrhea, sinus pressure, sinus pain, sneezing, sore throat, tinnitus, trouble swallowing and voice change.   Eyes: Negative.   Respiratory: Positive for apnea. Negative for cough, choking, chest tightness, shortness of breath, wheezing and stridor.   Cardiovascular: Negative.   Gastrointestinal: Negative.   Endocrine: Negative.   Genitourinary: Negative.   Musculoskeletal: Negative.     Skin: Negative.   Allergic/Immunologic: Negative.   Neurological: Negative.   Hematological: Negative.   Psychiatric/Behavioral: Negative.     Social History      He  reports that he has been smoking cigarettes. He has a 15.00 pack-year smoking history. He uses smokeless tobacco. He reports current alcohol use of about 3.0 standard drinks of alcohol per week. He reports that he does not use drugs.       Social History   Socioeconomic History  . Marital status: Married    Spouse name: Not on file  . Number of children: Not on file  . Years of education: Not on file  . Highest education level: Not on file  Occupational History  . Not on file  Social Needs  . Financial resource strain: Not on file  . Food insecurity:    Worry: Not on file    Inability: Not on file  . Transportation needs:    Medical: Not on file    Non-medical: Not on file  Tobacco Use  . Smoking status: Current Every Day Smoker    Packs/day: 1.00    Years: 15.00    Pack years: 15.00    Types: Cigarettes  . Smokeless tobacco: Current User  . Tobacco comment: Started in his 61s. QUIT IN 2008, AND STARTED BACK  Substance and Sexual Activity  . Alcohol use: Yes    Alcohol/week: 3.0 standard drinks    Types: 3 Standard drinks or equivalent per week    Comment: MODERATE USE  . Drug use: No  . Sexual activity: Not on file  Lifestyle  . Physical activity:    Days per  week: Not on file    Minutes per session: Not on file  . Stress: Not on file  Relationships  . Social connections:    Talks on phone: Not on file    Gets together: Not on file    Attends religious service: Not on file    Active member of club or organization: Not on file    Attends meetings of clubs or organizations: Not on file    Relationship status: Not on file  Other Topics Concern  . Not on file  Social History Narrative  . Not on file    No past medical history on file.   Patient Active Problem List   Diagnosis Date Noted   . Scalp cyst 05/01/2017  . Elevated PSA 08/10/2015  . Hyperglycemia 08/10/2015  . Tobacco abuse 05/18/2015  . Essential (primary) hypertension 05/18/2008  . Fam hx-ischem heart disease 05/18/2008  . OSA on CPAP 02/25/2007  . Insomnia due to medical condition 04/25/2006  . ED (erectile dysfunction) of organic origin 04/25/2006    Past Surgical History:  Procedure Laterality Date  . DENTAL SURGERY  06/2014  . [place]    Family History        Family Status  Relation Name Status  . Mother  Alive  . Father  Alive  . Brother 1 Alive  . Brother 2 Deceased        His family history includes Congestive Heart Failure in his mother; Coronary artery disease in his father; Diabetes in his father and mother; Heart disease in his father.      Allergies  Allergen Reactions  . Bupropion Itching     Current Outpatient Medications:  .  amLODipine (NORVASC) 5 MG tablet, TAKE 1 TABLET BY MOUTH  DAILY, Disp: 90 tablet, Rfl: 3 .  aspirin 81 MG tablet, Take 1 tablet by mouth daily., Disp: , Rfl:  .  ibuprofen (ADVIL,MOTRIN) 200 MG tablet, Take 200 mg by mouth every 6 (six) hours as needed., Disp: , Rfl:  .  tadalafil (CIALIS) 20 MG tablet, TAKE 1 TABLET BY MOUTH AS  NEEDED NO MORE THAN 1  TABLET IN A DAY, Disp: 9 tablet, Rfl: 4 .  valsartan-hydrochlorothiazide (DIOVAN HCT) 160-12.5 MG tablet, Take 1 tablet by mouth daily., Disp: 10 tablet, Rfl: 0 .  valsartan-hydrochlorothiazide (DIOVAN-HCT) 160-12.5 MG tablet, TAKE 1 TABLET BY MOUTH  DAILY, Disp: 90 tablet, Rfl: 4   Patient Care Team: Birdie Sons, MD as PCP - General (Family Medicine)    Objective:    Vitals: BP 108/66 (BP Location: Right Arm, Patient Position: Sitting, Cuff Size: Large)   Pulse 76   Temp 98.3 F (36.8 C) (Oral)   Resp 16   Ht 5\' 9"  (1.753 m)   Wt 221 lb (100.2 kg)   BMI 32.64 kg/m      Physical Exam   General Appearance:    Alert, cooperative, no distress, appears stated age,  overweight  Head:    Normocephalic, without obvious abnormality, atraumatic  Eyes:    PERRL, conjunctiva/corneas clear, EOM's intact, fundi    benign, both eyes       Ears:    Normal TM's and external ear canals, both ears  Nose:   Nares normal, septum midline, mucosa normal, no drainage   or sinus tenderness  Throat:   Lips, mucosa, and tongue normal; teeth and gums normal  Neck:   Supple, symmetrical, trachea midline, no adenopathy;  thyroid:  No enlargement/tenderness/nodules; no carotid   bruit or JVD  Back:     Symmetric, no curvature, ROM normal, no CVA tenderness  Lungs:     Clear to auscultation bilaterally, respirations unlabored  Chest wall:    No tenderness or deformity  Heart:    Regular rate and rhythm, S1 and S2 normal, no murmur, rub   or gallop  Abdomen:     Soft, non-tender, bowel sounds active all four quadrants,    no masses, no organomegaly  Genitalia:    deferred  Rectal:    deferred  Extremities:   Extremities normal, atraumatic, no cyanosis or edema  Pulses:   2+ and symmetric all extremities  Skin:   Skin color, texture, turgor normal, no rashes or lesions  Lymph nodes:   Cervical, supraclavicular, and axillary nodes normal  Neurologic:   CNII-XII intact. Normal strength, sensation and reflexes      throughout    Depression Screen PHQ 2/9 Scores 05/01/2017  PHQ - 2 Score 0  PHQ- 9 Score 0   EKG: NSR    Assessment & Plan:     Routine Health Maintenance and Physical Exam  Exercise Activities and Dietary recommendations Goals   None     Immunization History  Administered Date(s) Administered  . Tdap 05/18/2008, 07/04/2010    Health Maintenance  Topic Date Due  . HIV Screening  04/10/1975  . COLONOSCOPY  04/09/2010  . INFLUENZA VACCINE  06/26/2018  . TETANUS/TDAP  07/04/2020  . Hepatitis C Screening  Completed     Discussed health benefits of physical activity, and encouraged him to engage in regular exercise appropriate for his age  and condition.    --------------------------------------------------------------------  1. Annual physical exam Generally doing well - Comprehensive metabolic panel - Lipid panel  2. Tobacco abuse He is working on trying to quite, but would like to try Chantix to help.  - varenicline (CHANTIX STARTING MONTH PAK) 0.5 MG X 11 & 1 MG X 42 tablet; Take one 0.5 mg tablet daily for 3 days, then take one 0.5 mg tablet twice daily for 4 days, then take one 1 mg tablet twice daily  Dispense: 53 tablet; Refill: 0  He does not have 30 pack year history.   3. OSA on CPAP Using CPAP consistently every night and medically benefiting from its use.  4. . Need for influenza vaccination Recommended flu vaccine, but there is none in stock. Suggested get vaccine from pharmacy.   5. Prostate cancer screening  - PSA  6. Colon cancer screening  - Ambulatory referral to Gastroenterology  7. Essential (primary) hypertension Well controlled.  Continue current medications.   - EKG 12-Lead - Comprehensive metabolic panel - Lipid panel  8. Erectile dysfunction, unspecified erectile dysfunction type refill - tadalafil (CIALIS) 20 MG tablet; TAKE 1 TABLET BY MOUTH AS  NEEDED NO MORE THAN 1  TABLET IN A DAY  Dispense: 24 tablet; Refill: [place], MD  [facility]

## 2019-01-07 NOTE — Patient Instructions (Addendum)
.   Please review the attached list of medications and notify my office if there are any errors.   . Please bring all of your medications to every appointment so we can make sure that our medication list is the same as yours.    I recommend that you get a flu vaccine this year.

## 2019-01-08 ENCOUNTER — Telehealth: Payer: Self-pay

## 2019-01-08 ENCOUNTER — Other Ambulatory Visit: Payer: Self-pay | Admitting: Family Medicine

## 2019-01-08 DIAGNOSIS — R972 Elevated prostate specific antigen [PSA]: Secondary | ICD-10-CM

## 2019-01-08 DIAGNOSIS — R739 Hyperglycemia, unspecified: Secondary | ICD-10-CM

## 2019-01-08 LAB — COMPREHENSIVE METABOLIC PANEL
ALT: 21 IU/L (ref 0–44)
AST: 16 IU/L (ref 0–40)
Albumin/Globulin Ratio: 1.9 (ref 1.2–2.2)
Albumin: 4.6 g/dL (ref 3.8–4.9)
Alkaline Phosphatase: 64 IU/L (ref 39–117)
BUN/Creatinine Ratio: 8 — ABNORMAL LOW (ref 9–20)
BUN: 13 mg/dL (ref 6–24)
Bilirubin Total: 0.7 mg/dL (ref 0.0–1.2)
CO2: 22 mmol/L (ref 20–29)
Calcium: 9.8 mg/dL (ref 8.7–10.2)
Chloride: 98 mmol/L (ref 96–106)
Creatinine, Ser: 1.58 mg/dL — ABNORMAL HIGH (ref 0.76–1.27)
GFR calc Af Amer: 55 mL/min/{1.73_m2} — ABNORMAL LOW (ref >59)
GFR calc non Af Amer: 48 mL/min/{1.73_m2} — ABNORMAL LOW (ref >59)
Globulin, Total: 2.4 g/dL (ref 1.5–4.5)
Glucose: 159 mg/dL — ABNORMAL HIGH (ref 65–99)
Potassium: 4.4 mmol/L (ref 3.5–5.2)
Sodium: 140 mmol/L (ref 134–144)
Total Protein: 7 g/dL (ref 6.0–8.5)

## 2019-01-08 LAB — PSA: Prostate Specific Ag, Serum: 4.5 ng/mL — ABNORMAL HIGH (ref 0.0–4.0)

## 2019-01-08 LAB — LIPID PANEL
Chol/HDL Ratio: 3.9 ratio (ref 0.0–5.0)
Cholesterol, Total: 210 mg/dL — ABNORMAL HIGH (ref 100–199)
HDL: 54 mg/dL (ref >39)
LDL Calculated: 129 mg/dL — ABNORMAL HIGH (ref 0–99)
Triglycerides: 136 mg/dL (ref 0–149)
VLDL Cholesterol Cal: 27 mg/dL (ref 5–40)

## 2019-01-08 NOTE — Telephone Encounter (Signed)
-----   Message from Birdie Sons, MD sent at 01/08/2019  8:01 AM EST ----- PSA has gone up from 3.2 to 4.5. need to recheck PSA with free PSA in 2-3 weeks. (future order has been entered.) Blood sugar is elevated. Will need to check hgba1c when he get blood work done to make sure he's not becoming diabetic. Cholesterol is a little high at 210. Avoid sweets and starchy foods and work on losing weight.

## 2019-01-08 NOTE — Telephone Encounter (Signed)
LMTCB 01/08/2019  Thanks,   -Mickel Baas

## 2019-01-12 NOTE — Telephone Encounter (Signed)
Patient advised as below and agrees to have labs repeated. Patient will call back to have lab slip printed.

## 2019-01-22 ENCOUNTER — Other Ambulatory Visit: Payer: Self-pay

## 2019-01-22 DIAGNOSIS — R739 Hyperglycemia, unspecified: Secondary | ICD-10-CM | POA: Diagnosis not present

## 2019-01-22 DIAGNOSIS — R972 Elevated prostate specific antigen [PSA]: Secondary | ICD-10-CM

## 2019-01-23 ENCOUNTER — Other Ambulatory Visit: Payer: Self-pay | Admitting: Family Medicine

## 2019-01-23 DIAGNOSIS — R972 Elevated prostate specific antigen [PSA]: Secondary | ICD-10-CM

## 2019-01-23 LAB — HEMOGLOBIN A1C
Est. average glucose Bld gHb Est-mCnc: 128 mg/dL
Hgb A1c MFr Bld: 6.1 % — ABNORMAL HIGH (ref 4.8–5.6)

## 2019-01-23 LAB — PSA TOTAL (REFLEX TO FREE): Prostate Specific Ag, Serum: 4.9 ng/mL — ABNORMAL HIGH (ref 0.0–4.0)

## 2019-01-23 LAB — FPSA% REFLEX
% FREE PSA: 13.5 %
PSA, FREE: 0.66 ng/mL

## 2019-01-27 ENCOUNTER — Other Ambulatory Visit: Payer: Self-pay | Admitting: Family Medicine

## 2019-01-27 ENCOUNTER — Encounter: Payer: Self-pay | Admitting: *Deleted

## 2019-01-27 DIAGNOSIS — N529 Male erectile dysfunction, unspecified: Secondary | ICD-10-CM

## 2019-01-27 MED ORDER — TADALAFIL 20 MG PO TABS: ORAL_TABLET | ORAL | 4 refills | Status: DC

## 2019-01-27 NOTE — Telephone Encounter (Signed)
Pt needing his refill of tadalafil (CIALIS) 20 MG tablet  called into:  [facility] [place], [place] AT [facility] [address] Shari Prows [phone] (Phone) [phone] (Fax)   Thanks,   [employer]

## 2019-01-27 NOTE — Addendum Note (Signed)
Addended by: Ashley Royalty E on: 01/27/2019 03:56 PM   Modules accepted: Orders

## 2019-02-02 ENCOUNTER — Telehealth: Payer: Self-pay | Admitting: Family Medicine

## 2019-02-02 NOTE — Telephone Encounter (Signed)
Please check with patient and see if he is doing ok with chantix. If so will send in prescription for maintenence pack.

## 2019-02-02 NOTE — Telephone Encounter (Signed)
-----   Message from Birdie Sons, MD sent at 01/10/2019  9:57 AM EST ----- Regarding: call to see if need chantix refill before 3/12

## 2019-02-03 NOTE — Telephone Encounter (Signed)
Pt returned call ° °teri °

## 2019-02-03 NOTE — Telephone Encounter (Signed)
LMTCB 02/03/2019  Thanks,   -Mickel Baas

## 2019-02-04 MED ORDER — VARENICLINE TARTRATE 1 MG PO TABS: 1.0000 mg | ORAL_TABLET | Freq: Two times a day (BID) | ORAL | 4 refills | Status: DC

## 2019-02-04 NOTE — Telephone Encounter (Signed)
Patient states that he is doing well on chantix the only side effect he reports is "crazy dreams." Patient stats that he has cutt back on smoking and would like to continue on medication. Patients pharmacy is [facility] in [place]

## 2019-02-05 ENCOUNTER — Telehealth: Payer: Self-pay

## 2019-02-05 NOTE — Telephone Encounter (Signed)
Returned patients call to schedule colonoscopy.  LVM for him on his work number to call office back.  Thanks [employer]

## 2019-02-06 ENCOUNTER — Telehealth: Payer: Self-pay

## 2019-02-06 ENCOUNTER — Other Ambulatory Visit: Payer: Self-pay

## 2019-02-06 DIAGNOSIS — Z1211 Encounter for screening for malignant neoplasm of colon: Secondary | ICD-10-CM

## 2019-02-06 MED ORDER — NA SULFATE-K SULFATE-MG SULF 17.5-3.13-1.6 GM/177ML PO SOLN: 1.0000 | Freq: Once | ORAL | 0 refills | Status: AC

## 2019-02-06 NOTE — Telephone Encounter (Signed)
Gastroenterology Pre-Procedure Review  Request Date: 02/19/19 Requesting Physician: Dr. Vicente Males  PATIENT REVIEW QUESTIONS: The patient responded to the following health history questions as indicated:    1. Are you having any GI issues? no 2. Do you have a personal history of Polyps? no 3. Do you have a family history of Colon Cancer or Polyps? no 4. Diabetes Mellitus? no 5. Joint replacements in the past 12 months?no 6. Major health problems in the past 3 months?no 7. Any artificial heart valves, MVP, or defibrillator?no    MEDICATIONS & ALLERGIES:    Patient reports the following regarding taking any anticoagulation/antiplatelet therapy:   Plavix, Coumadin, Eliquis, Xarelto, Lovenox, Pradaxa, Brilinta, or Effient? no Aspirin? no  Patient confirms/reports the following medications:  Current Outpatient Medications  Medication Sig Dispense Refill  . amLODipine (NORVASC) 5 MG tablet TAKE 1 TABLET BY MOUTH  DAILY 90 tablet 3  . ibuprofen (ADVIL,MOTRIN) 200 MG tablet Take 200 mg by mouth every 6 (six) hours as needed.    . Na Sulfate-K Sulfate-Mg Sulf 17.5-3.13-1.6 GM/177ML SOLN Take 1 kit by mouth once for 1 dose. 354 mL 0  . tadalafil (CIALIS) 20 MG tablet TAKE 1 TABLET BY MOUTH AS  NEEDED NO MORE THAN 1  TABLET IN A DAY 24 tablet 4  . valsartan-hydrochlorothiazide (DIOVAN-HCT) 160-12.5 MG tablet TAKE 1 TABLET BY MOUTH  DAILY 90 tablet 4  . varenicline (CHANTIX CONTINUING MONTH PAK) 1 MG tablet Take 1 tablet (1 mg total) by mouth 2 (two) times daily. 60 tablet 4  . varenicline (CHANTIX STARTING MONTH PAK) 0.5 MG X 11 & 1 MG X 42 tablet Take one 0.5 mg tablet daily for 3 days, then take one 0.5 mg tablet twice daily for 4 days, then take one 1 mg tablet twice daily 53 tablet 0   No current facility-administered medications for this visit.     Patient confirms/reports the following allergies:  Allergies  Allergen Reactions  . Bupropion Itching    No orders of the defined types were  placed in this encounter.   AUTHORIZATION INFORMATION Primary Insurance: 1D#: Group #:  Secondary Insurance: 1D#: Group #:  SCHEDULE INFORMATION: Date: 02/19/19 Time: Location:

## 2019-02-13 ENCOUNTER — Telehealth: Payer: Self-pay

## 2019-02-13 NOTE — Telephone Encounter (Signed)
Contacted pt to inform that we will be canceling their upcoming endoscopy procedure due to precautions for the COVID-19 outbreak. Pt will be rescheduled in the near future. Pt agrees.

## 2019-02-19 ENCOUNTER — Ambulatory Visit: Admit: 2019-02-19 | Payer: 59 | Admitting: Gastroenterology

## 2019-02-19 SURGERY — COLONOSCOPY WITH PROPOFOL
Anesthesia: General

## 2019-03-02 ENCOUNTER — Ambulatory Visit: Payer: 59 | Admitting: Urology

## 2019-04-01 ENCOUNTER — Ambulatory Visit: Payer: 59 | Admitting: Urology

## 2019-05-19 ENCOUNTER — Encounter: Payer: Self-pay | Admitting: Urology

## 2019-05-19 ENCOUNTER — Ambulatory Visit (INDEPENDENT_AMBULATORY_CARE_PROVIDER_SITE_OTHER): Payer: 59 | Admitting: Urology

## 2019-05-19 ENCOUNTER — Other Ambulatory Visit: Payer: Self-pay

## 2019-05-19 VITALS — BP 125/77 | HR 78 | Ht 69.0 in | Wt 215.0 lb

## 2019-05-19 DIAGNOSIS — R972 Elevated prostate specific antigen [PSA]: Secondary | ICD-10-CM

## 2019-05-19 NOTE — Progress Notes (Signed)
05/19/2019 1:31 PM   [place] Yarbro Steib [birth year] [id number]  Referring provider: Birdie Sons, MD [address] [place],  [place] [address]  CC: Elevated PSA  HPI: I saw Mr. Noguez in urology clinic today in consultation for elevated PSA from Dr. Caryn Section.  He is a 58 year old healthy male who was found to have an elevated PSA of 4.5 by his primary care physician on 01/07/2019.  This was repeated on 01/22/2019, and remained elevated at 4.9, with 13.5% free.  PSA was previously 3.2 in June 2018, and 4.2 in May 2016.  He has never undergone prostate biopsy.  He denies any family history of prostate cancer.  He denies any significant urinary symptoms.  He takes 20 mg Cialis on demand for ED.  He denies any weight loss or bone pain.  There are no aggravating or alleviating factors.  Severity is mild.  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   PMH: Past Medical History:  Diagnosis Date  . ED (erectile dysfunction) of organic origin 04/25/2006  . Elevated PSA 08/10/2015  . Essential (primary) hypertension 05/18/2008  . Fam hx-ischem heart disease 05/18/2008  . Hyperglycemia 08/10/2015  . Insomnia due to medical condition 04/25/2006  . OSA on CPAP 02/25/2007   Severe sleep apnea, AHI=96, desaturation to 71% on split night 03-06-2007. On CPAP  . Scalp cyst 05/01/2017  . Tobacco abuse 05/18/2015    Surgical History: Past Surgical History:  Procedure Laterality Date  . DENTAL SURGERY  06/2014  . WISDOM TOOTH EXTRACTION  1989    Allergies:  Allergies  Allergen Reactions  . Bupropion Itching    Family History: Family History  Problem Relation Age of Onset  . Diabetes Mother   . Congestive Heart Failure Mother   . Heart disease Father   . Coronary artery disease Father   . Diabetes Father     Social History:  reports that he has been smoking cigarettes. He has a 15.00 pack-year smoking history. He has never used smokeless tobacco. He reports current  alcohol use of about 3.0 standard drinks of alcohol per week. He reports that he does not use drugs.  ROS: Please see flowsheet from today's date for complete review of systems.  Physical Exam: BP 125/77   Pulse 78   Ht 5\' 9"  (1.753 m)   Wt 215 lb (97.5 kg)   BMI 31.75 kg/m    Constitutional:  Alert and oriented, No acute distress. Cardiovascular: No clubbing, cyanosis, or edema. Respiratory: Normal respiratory effort, no increased work of breathing. GI: Abdomen is soft, nontender, nondistended, no abdominal masses GU: No CVA tenderness DRE: 50 g, smooth, no masses or nodules Lymph: No cervical or inguinal lymphadenopathy. Skin: No rashes, bruises or suspicious lesions. Neurologic: Grossly intact, no focal deficits, moving all 4 extremities. Psychiatric: Normal mood and affect.  Laboratory Data: Reviewed  PSA history 01/22/2019: 4.9, 13.5% free 01/07/2019: 4.5 04/2017: 3.2 03/2015: 4.2   Pertinent Imaging: None to review  Assessment & Plan:   In summary, the patient is a healthy 58 year old African-American male with no family history of prostate cancer who presents with a persistently elevated PSA to 4.9, with 13.5% free.  We reviewed the implications of an elevated PSA and the uncertainty surrounding it. In general, a man's PSA increases with age and is produced by both normal and cancerous prostate tissue. The differential diagnosis for elevated PSA includes BPH, prostate cancer, infection, recent intercourse/ejaculation, recent urethroscopic manipulation (foley placement/cystoscopy) or  trauma, and prostatitis.   Management of an elevated PSA can include observation, prostate MRI , or prostate biopsy and we discussed this in detail. Our goal is to detect clinically significant prostate cancers, and manage with either active surveillance, surgery, or radiation for localized disease. Risks of prostate biopsy include bleeding, infection (including life threatening sepsis), pain,  and lower urinary symptoms. Hematuria, hematospermia, and blood in the stool are all common after biopsy and can persist up to 4 weeks.   -He would like to proceed with prostate MRI, with the understanding that he would need a biopsy if any abnormal areas are seen  [name], [place] [address], [place], [place] [address] [phone]

## 2019-05-19 NOTE — Patient Instructions (Signed)
Prostate Cancer Screening  The prostate is a walnut-sized gland that is located below the bladder and in front of the rectum in males. The function of the prostate (prostate gland) is to add fluid to semen during ejaculation. Prostate cancer is the second most common type of cancer in men. A screening test for cancer is a test that is done before cancer symptoms start. Screening can help to identify cancer at an early stage, when the cancer can be treated more easily. The recommended prostate cancer screening test is a blood test called the prostate-specific antigen (PSA) test. PSA is a protein that is made in the prostate. As you age, your prostate naturally produces more PSA. Abnormally high PSA levels may be caused by:  Prostate cancer.  An enlarged prostate that is not caused by cancer (benign prostatic hyperplasia, BPH). This condition is very common in older men.  A prostate gland infection (prostatitis).  Medicines to assist with hair growth, such as finasteride. Depending on the PSA results, you may need more tests, such as:  A physical exam to check the size of your prostate gland.  Blood and imaging tests.  A procedure to remove tissue samples from your prostate gland for testing (biopsy). Who should have screening? Screening recommendations vary based on age.  If you are younger than age 40, screening is not recommended.  If you are age 40-54 and you have no risk factors, screening is not recommended.  If you are younger than age 55, ask your health care provider if you need screening if you have one of these risk factors: ? Being of African-American descent. ? Having a family history of prostate cancer.  If you are age 55-69, talk with your health care provider about your need for screening and how often screening should be done.  If you are older than age 70, screening is not recommended. This is because the risks that screening can cause are greater than the benefits  that it may provide (risks outweigh the benefits). If you are at high risk for prostate cancer, your health care provider may recommend that you have screenings more often or start screening at a younger age. You may be at high risk if you:  Are older than age 55.  Are African-American.  Have a father, brother, or uncle who has been diagnosed with prostate cancer. The risk may be higher if your family member's cancer occurred at an early age. What are the benefits of screening? There is a small chance that screening may lower your risk of dying from prostate cancer. The chance is small because prostate cancer is typically a slow-growing cancer, and most men with prostate cancer die from a different cause. What are the risks of screening? The main risk of prostate cancer screening is diagnosing and treating prostate cancer that would never have caused any symptoms or problems (overdiagnosis and overtreatment). PSA screening cannot tell you if your PSA is high due to cancer or a different cause. A prostate biopsy is the only procedure to diagnose prostate cancer. Even the results of a biopsy may not tell you if your cancer needs to be treated. Slow-growing prostate cancer may not need any treatment other than monitoring, so diagnosing and treating it may cause unnecessary stress or other side effects. A prostate biopsy may also cause:  Infection or fever.  A false negative. This is a result that shows that you do not have prostate cancer when you actually do have prostate cancer. Questions   to ask your health care provider  When should I start prostate cancer screening?  What is my risk for prostate cancer?  How often do I need screening?  What type of screening tests do I need?  How do I get my test results?  What do my results mean?  Do I need treatment? Contact a health care provider if:  You have difficulty urinating.  You have pain when you urinate or ejaculate.  You have  blood in your urine or semen.  You have pain in your back or in the area of your prostate.  You have trouble getting or maintaining an erection (erectile dysfunction, ED). Summary  Prostate cancer is a common type of cancer in men. The prostate (prostate gland) is located below the bladder and in front of the rectum. This gland adds fluid to semen during ejaculation.  Prostate cancer screening may identify cancer at an early stage, when the cancer can be treated more easily.  The prostate-specific antigen (PSA) test is the recommended screening test for prostate cancer.  Discuss the risks and benefits of prostate cancer screening with your health care provider. If you are age 50 or older, screening is likely to lead to more risks than benefits (risks outweigh the benefits). This information is not intended to replace advice given to you by your health care provider. Make sure you discuss any questions you have with your health care provider. Document Released: 08/23/2017 Document Revised: 08/23/2017 Document Reviewed: 08/23/2017 Elsevier Interactive Patient Education  2019 [employer]   Transrectal Ultrasound-Guided Prostate Biopsy A transrectal ultrasound-guided prostate biopsy is a procedure to remove samples of prostate tissue for testing. The procedure uses ultrasound images to guide the process of removing the samples. The samples are taken to a lab to be checked for prostate cancer. This procedure is usually done to evaluate the prostate gland of men who have raised (elevated) levels of prostate-specific antigen (PSA), which can be a sign of prostate cancer. Tell a health care provider about:  Any allergies you have.  All medicines you are taking, including vitamins, herbs, eye drops, creams, and over-the-counter medicines.  Any problems you or family members have had with anesthetic medicines.  Any blood disorders you have.  Any surgeries you have had.  Any medical  conditions you have. What are the risks? Generally, this is a safe procedure. However, problems may occur, including:  Prostate infection.  Bleeding from the rectum.  Blood in the urine.  Allergic reactions to medicines.  Damage to surrounding structures such as blood vessels, organs, or muscles.  Difficulty passing urine.  Nerve damage. This is usually temporary.  Pain. What happens before the procedure? Staying hydrated Follow instructions from your health care provider about hydration, which may include:  Up to 2 hours before the procedure - you may continue to drink clear liquids, such as water, clear fruit juice, black coffee, and plain tea.  Eating and drinking restrictions Follow instructions from your health care provider about eating and drinking, which may include:  8 hours before the procedure - stop eating heavy meals or foods such as meat, fried foods, or fatty foods.  6 hours before the procedure - stop eating light meals or foods, such as toast or cereal.  6 hours before the procedure - stop drinking milk or drinks that contain milk.  2 hours before the procedure - stop drinking clear liquids. Medicines Ask your health care provider about:  Changing or stopping your regular medicines. This  is especially important if you are taking diabetes medicines or blood thinners.  Taking over-the-counter medicines, vitamins, herbs, and supplements.  Taking medicines such as aspirin and ibuprofen. These medicines can thin your blood. Do not take these medicines unless your health care provider tells you to take them. General instructions  You may be given antibiotic medicine to help prevent infection. If so, take the antibiotic as told by your health care provider.  You will be given an enema. During an enema, a liquid is injected into your rectum to clear out waste.  You may have a blood or urine sample taken.  Plan to have someone take you home from the hospital  or clinic. What happens during the procedure?   To lower your risk of infection: ? Your health care team will wash or sanitize their hands. ? Hair may be removed from the surgical area. ? Your skin will be cleaned with a germ-killing soap. ? You may be given antibiotic medicine.  An IV will be inserted into one of your veins.  You will be given one or both of the following: ? A medicine to help you relax (sedative). ? A medicine to numb the area (local anesthetic).  You will be placed on your left side, and your knees will be bent toward your chest.  A probe with lubricated gel will be placed into your rectum, and images will be taken of your prostate and surrounding structures.  Numbing medicine will be injected into your prostate.  A biopsy needle will be inserted through your rectum and guided to your prostate using the ultrasound images.  Prostate tissue samples will be removed, and the needle will then be removed.  The biopsy samples will be sent to a lab to be tested. The procedure may vary among health care providers and hospitals. What happens after the procedure?  Your blood pressure, heart rate, breathing rate, and blood oxygen level will be monitored until the medicines you were given have worn off.  You may have some discomfort in the rectal area. You will be given pain medicine as needed.  Do not drive for 24 hours if you received a sedative. Summary  A transrectal ultrasound-guided biopsy removes samples of tissue from your prostate.  This procedure is usually done to evaluate the prostate gland of men who have raised (elevated) levels of prostate-specific antigen (PSA), which can be a sign of prostate cancer.  After your procedure, you may feel some discomfort in the rectal area.  Plan to have someone take you home from the hospital or clinic. This information is not intended to replace advice given to you by your health care provider. Make sure you discuss  any questions you have with your health care provider. Document Released: 03/29/2014 Document Revised: 05/19/2018 Document Reviewed: 02/08/2017 Elsevier Interactive Patient Education  2019 [employer].

## 2019-05-21 ENCOUNTER — Ambulatory Visit: Payer: 59 | Admitting: Urology

## 2019-05-27 ENCOUNTER — Ambulatory Visit: Payer: 59 | Admitting: Urology

## 2019-06-07 ENCOUNTER — Other Ambulatory Visit: Payer: Self-pay | Admitting: Family Medicine

## 2019-06-18 ENCOUNTER — Ambulatory Visit
Admission: RE | Admit: 2019-06-18 | Discharge: 2019-06-18 | Disposition: A | Discharge status: Home / Self Care | Payer: 59 | Source: Ambulatory Visit | Attending: Urology | Admitting: Urology

## 2019-06-18 ENCOUNTER — Other Ambulatory Visit: Payer: Self-pay

## 2019-06-18 DIAGNOSIS — R972 Elevated prostate specific antigen [PSA]: Secondary | ICD-10-CM | POA: Insufficient documentation

## 2019-06-18 LAB — POCT I-STAT CREATININE: Creatinine, Ser: 1.4 mg/dL — ABNORMAL HIGH (ref 0.61–1.24)

## 2019-06-18 MED ORDER — GADOBUTROL 1 MMOL/ML IV SOLN
9.0000 mL | Freq: Once | INTRAVENOUS | Status: AC | PRN
  Administered 2019-06-18: 9 mL via INTRAVENOUS

## 2019-06-22 ENCOUNTER — Telehealth: Payer: Self-pay | Admitting: *Deleted

## 2019-06-22 DIAGNOSIS — R972 Elevated prostate specific antigen [PSA]: Secondary | ICD-10-CM

## 2019-06-22 NOTE — Telephone Encounter (Addendum)
Patient informed-verbalized understanding. Appointment made for lab and follow up  ----- Message from [name], MD sent at 06/22/2019  4:47 PM EDT ----- Doristine Devoid news, no worrisome findings of prostate cancer on prostate MRI. No need for prostate biopsy at this time,follow up in one year with PSA with reflex to free  Nickolas Madrid, MD 06/22/2019

## 2019-06-24 ENCOUNTER — Encounter: Payer: Self-pay | Admitting: *Deleted

## 2019-07-16 ENCOUNTER — Other Ambulatory Visit: Payer: Self-pay

## 2019-07-16 DIAGNOSIS — Z1211 Encounter for screening for malignant neoplasm of colon: Secondary | ICD-10-CM

## 2019-07-16 MED ORDER — SUPREP BOWEL PREP KIT 17.5-3.13-1.6 GM/177ML PO SOLN: 1.0000 | ORAL | 0 refills | Status: DC

## 2019-07-17 NOTE — Discharge Instructions (Signed)

## 2019-07-21 ENCOUNTER — Other Ambulatory Visit: Admission: RE | Admit: 2019-07-21 | Payer: 59 | Source: Ambulatory Visit

## 2019-07-28 ENCOUNTER — Other Ambulatory Visit: Admission: RE | Admit: 2019-07-28 | Payer: 59 | Source: Ambulatory Visit

## 2019-07-28 NOTE — Anesthesia Preprocedure Evaluation (Addendum)
Anesthesia Evaluation  Patient identified by MRN, date of birth, ID band Patient awake    History of Anesthesia Complications Negative for: history of anesthetic complications  Airway Mallampati: II  TM Distance: >3 FB Neck ROM: Full    Dental no notable dental hx.    Pulmonary sleep apnea , Current Smoker and Patient abstained from smoking.,    Pulmonary exam normal        Cardiovascular hypertension, Normal cardiovascular exam     Neuro/Psych negative neurological ROS     GI/Hepatic   Endo/Other  negative endocrine ROS  Renal/GU CRFRenal disease     Musculoskeletal negative musculoskeletal ROS (+)   Abdominal   Peds  Hematology negative hematology ROS (+)   Anesthesia Other Findings   Reproductive/Obstetrics                           Anesthesia Physical Anesthesia Plan  ASA: II  Anesthesia Plan: General   Post-op Pain Management:    Induction: Intravenous  PONV Risk Score and Plan: 1 and Propofol infusion and TIVA  Airway Management Planned: Natural Airway and Nasal Cannula  Additional Equipment:   Intra-op Plan:   Post-operative Plan:   Informed Consent: I have reviewed the patients History and Physical, chart, labs and discussed the procedure including the risks, benefits and alternatives for the proposed anesthesia with the patient or authorized representative who has indicated his/her understanding and acceptance.       Plan Discussed with: CRNA and Anesthesiologist  Anesthesia Plan Comments:        Anesthesia Quick Evaluation   Active Ambulatory Problems    Diagnosis Date Noted  . Insomnia due to medical condition 04/25/2006  . Essential (primary) hypertension 05/18/2008  . Fam hx-ischem heart disease 05/18/2008  . ED (erectile dysfunction) of organic origin 04/25/2006  . OSA on CPAP 02/25/2007  . Tobacco abuse 05/18/2015  . Elevated PSA 08/10/2015   . Hyperglycemia 08/10/2015  . Scalp cyst 05/01/2017   Resolved Ambulatory Problems    Diagnosis Date Noted  . No Resolved Ambulatory Problems   No Additional Past Medical History    CBC No results found for: WBC, RBC, HGB, HCT, PLT, MCV, MCH, MCHC, RDW, LYMPHSABS, MONOABS, EOSABS, BASOSABS  CMP     Component Value Date/Time   NA 140 01/07/2019 1000   K 4.4 01/07/2019 1000   CL 98 01/07/2019 1000   CO2 22 01/07/2019 1000   GLUCOSE 159 (H) 01/07/2019 1000   BUN 13 01/07/2019 1000   CREATININE 1.40 (H) 06/18/2019 1615   CALCIUM 9.8 01/07/2019 1000   PROT 7.0 01/07/2019 1000   ALBUMIN 4.6 01/07/2019 1000   AST 16 01/07/2019 1000   ALT 21 01/07/2019 1000   ALKPHOS 64 01/07/2019 1000   BILITOT 0.7 01/07/2019 1000   GFRNONAA 48 (L) 01/07/2019 1000   GFRAA 55 (L) 01/07/2019 1000    COAGS No results found for: INR, PTT  I have seen and consented the patient, Jasaun Plazola Porr. I have answered all of his questions regarding anesthesia. he is appropriately NPO.   Josephina Shih, MD Anesthesia

## 2019-08-04 ENCOUNTER — Other Ambulatory Visit: Admission: RE | Admit: 2019-08-04 | Payer: 59 | Source: Ambulatory Visit

## 2019-08-04 ENCOUNTER — Encounter: Payer: Self-pay | Admitting: *Deleted

## 2019-08-04 ENCOUNTER — Other Ambulatory Visit: Payer: Self-pay

## 2019-08-05 ENCOUNTER — Other Ambulatory Visit: Payer: Self-pay

## 2019-08-05 DIAGNOSIS — Z1211 Encounter for screening for malignant neoplasm of colon: Secondary | ICD-10-CM

## 2019-08-11 ENCOUNTER — Other Ambulatory Visit
Admission: RE | Admit: 2019-08-11 | Discharge: 2019-08-11 | Disposition: A | Discharge status: Home / Self Care | Payer: 59 | Source: Ambulatory Visit | Attending: Gastroenterology | Admitting: Gastroenterology

## 2019-08-11 ENCOUNTER — Other Ambulatory Visit: Payer: Self-pay

## 2019-08-11 DIAGNOSIS — Z01812 Encounter for preprocedural laboratory examination: Secondary | ICD-10-CM | POA: Diagnosis not present

## 2019-08-11 DIAGNOSIS — Z20828 Contact with and (suspected) exposure to other viral communicable diseases: Secondary | ICD-10-CM | POA: Diagnosis not present

## 2019-08-11 LAB — SARS CORONAVIRUS 2 (TAT 6-24 HRS): SARS Coronavirus 2: NEGATIVE

## 2019-08-14 ENCOUNTER — Ambulatory Visit
Admission: RE | Admit: 2019-08-14 | Discharge: 2019-08-14 | Disposition: A | Discharge status: Home / Self Care | Payer: 59 | Attending: Gastroenterology | Admitting: Gastroenterology

## 2019-08-14 ENCOUNTER — Ambulatory Visit: Payer: 59 | Admitting: Anesthesiology

## 2019-08-14 ENCOUNTER — Ambulatory Visit
Admission: RE | Disposition: A | Discharge status: Home / Self Care | Payer: Self-pay | Source: Home / Self Care | Attending: Gastroenterology

## 2019-08-14 DIAGNOSIS — D125 Benign neoplasm of sigmoid colon: Secondary | ICD-10-CM | POA: Insufficient documentation

## 2019-08-14 DIAGNOSIS — G4733 Obstructive sleep apnea (adult) (pediatric): Secondary | ICD-10-CM | POA: Diagnosis not present

## 2019-08-14 DIAGNOSIS — I10 Essential (primary) hypertension: Secondary | ICD-10-CM | POA: Diagnosis not present

## 2019-08-14 DIAGNOSIS — K641 Second degree hemorrhoids: Secondary | ICD-10-CM | POA: Insufficient documentation

## 2019-08-14 DIAGNOSIS — Z1211 Encounter for screening for malignant neoplasm of colon: Secondary | ICD-10-CM

## 2019-08-14 DIAGNOSIS — K5730 Diverticulosis of large intestine without perforation or abscess without bleeding: Secondary | ICD-10-CM | POA: Insufficient documentation

## 2019-08-14 DIAGNOSIS — N529 Male erectile dysfunction, unspecified: Secondary | ICD-10-CM | POA: Diagnosis not present

## 2019-08-14 DIAGNOSIS — F17210 Nicotine dependence, cigarettes, uncomplicated: Secondary | ICD-10-CM | POA: Diagnosis not present

## 2019-08-14 DIAGNOSIS — K635 Polyp of colon: Secondary | ICD-10-CM

## 2019-08-14 DIAGNOSIS — Z79899 Other long term (current) drug therapy: Secondary | ICD-10-CM | POA: Diagnosis not present

## 2019-08-14 DIAGNOSIS — Z860101 Personal history of adenomatous and serrated colon polyps: Secondary | ICD-10-CM

## 2019-08-14 HISTORY — PX: POLYPECTOMY: SHX5525

## 2019-08-14 HISTORY — PX: COLONOSCOPY WITH PROPOFOL: SHX5780

## 2019-08-14 SURGERY — COLONOSCOPY WITH PROPOFOL
Anesthesia: General | Site: Rectum

## 2019-08-14 MED ORDER — LIDOCAINE HCL (CARDIAC) PF 100 MG/5ML IV SOSY
PREFILLED_SYRINGE | INTRAVENOUS | Status: DC | PRN
  Administered 2019-08-14: 30 mg via INTRAVENOUS

## 2019-08-14 MED ORDER — GLYCOPYRROLATE 0.2 MG/ML IJ SOLN
INTRAMUSCULAR | Status: DC | PRN
  Administered 2019-08-14: 0.1 mg via INTRAVENOUS

## 2019-08-14 MED ORDER — ONDANSETRON HCL 4 MG/2ML IJ SOLN: 4.0000 mg | Freq: Once | INTRAMUSCULAR | Status: DC | PRN

## 2019-08-14 MED ORDER — STERILE WATER FOR IRRIGATION IR SOLN
Status: DC | PRN
  Administered 2019-08-14: .05 mL

## 2019-08-14 MED ORDER — LACTATED RINGERS IV SOLN
100.0000 mL/h | INTRAVENOUS | Status: DC
  Administered 2019-08-14: 11:00:00 100 mL/h via INTRAVENOUS

## 2019-08-14 MED ORDER — ACETAMINOPHEN 10 MG/ML IV SOLN: 1000.0000 mg | Freq: Once | INTRAVENOUS | Status: DC | PRN

## 2019-08-14 MED ORDER — PROPOFOL 10 MG/ML IV BOLUS
INTRAVENOUS | Status: DC | PRN
  Administered 2019-08-14 (×3): 40 mg via INTRAVENOUS
  Administered 2019-08-14: 120 mg via INTRAVENOUS

## 2019-08-14 SURGICAL SUPPLY — 8 items
CANISTER SUCT 1200ML W/VALVE (MISCELLANEOUS) ×3 IMPLANT
FORCEPS BIOP RAD 4 LRG CAP 4 (CUTTING FORCEPS) IMPLANT
GOWN CVR UNV OPN BCK APRN NK (MISCELLANEOUS) ×4 IMPLANT
GOWN ISOL THUMB LOOP REG UNIV (MISCELLANEOUS) ×4
KIT ENDO PROCEDURE OLY (KITS) ×3 IMPLANT
SNARE SHORT THROW 13M SML OVAL (MISCELLANEOUS) ×3 IMPLANT
TRAP ETRAP POLY (MISCELLANEOUS) ×3 IMPLANT
WATER STERILE IRR 250ML POUR (IV SOLUTION) ×3 IMPLANT

## 2019-08-14 NOTE — Anesthesia Postprocedure Evaluation (Signed)
Anesthesia Post Note  Patient: Jerry Walsh  Procedure(s) Performed: COLONOSCOPY WITH PROPOFOL (N/A Rectum) POLYPECTOMY (Rectum)  Patient location during evaluation: PACU Anesthesia Type: General Level of consciousness: awake and alert Pain management: pain level controlled Vital Signs Assessment: post-procedure vital signs reviewed and stable Respiratory status: spontaneous breathing, nonlabored ventilation, respiratory function stable and patient connected to nasal cannula oxygen Cardiovascular status: blood pressure returned to baseline and stable Postop Assessment: no apparent nausea or vomiting Anesthetic complications: no    Adele Barthel Timothea Bodenheimer

## 2019-08-14 NOTE — H&P (Signed)
Jerry Lame, MD [address] [place], [place] [address] Phone: [phone] Fax : [phone]  Primary Care Physician:  Birdie Sons, MD Primary Gastroenterologist:  Dr. Allen Norris  Pre-Procedure History & Physical: HPI:  Jerry Walsh is a 58 y.o. male is here for a screening colonoscopy.   Past Medical History:  Diagnosis Date  . ED (erectile dysfunction) of organic origin 04/25/2006  . Elevated PSA 08/10/2015  . Essential (primary) hypertension 05/18/2008  . Fam hx-ischem heart disease 05/18/2008  . Hyperglycemia 08/10/2015  . Insomnia due to medical condition 04/25/2006  . OSA on CPAP 02/25/2007   Severe sleep apnea, AHI=96, desaturation to 71% on split night 03-06-2007. On CPAP  . Scalp cyst 05/01/2017  . Tobacco abuse 05/18/2015    Past Surgical History:  Procedure Laterality Date  . DENTAL SURGERY  06/2014  . [place]    Prior to Admission medications   Medication Sig Start Date End Date Taking? Authorizing Provider  amLODipine (NORVASC) 5 MG tablet TAKE 1 TABLET BY MOUTH  DAILY 06/07/19  Yes Birdie Sons, MD  ibuprofen (ADVIL,MOTRIN) 200 MG tablet Take 200 mg by mouth every 6 (six) hours as needed.   Yes [provider]  Melatonin 10 MG TABS Take by mouth at bedtime as needed.   Yes [provider]  tadalafil (CIALIS) 20 MG tablet TAKE 1 TABLET BY MOUTH AS  NEEDED NO MORE THAN 1  TABLET IN A DAY 01/27/19  Yes Birdie Sons, MD  valsartan-hydrochlorothiazide (DIOVAN-HCT) 160-12.5 MG tablet TAKE 1 TABLET BY MOUTH  DAILY 09/26/18  Yes Birdie Sons, MD  varenicline (CHANTIX CONTINUING MONTH PAK) 1 MG tablet Take 1 tablet (1 mg total) by mouth 2 (two) times daily. 02/04/19  Yes Birdie Sons, MD  Na Sulfate-K Sulfate-Mg Sulf (SUPREP BOWEL PREP KIT) 17.5-3.13-1.6 GM/177ML SOLN Take 1 kit by mouth as directed. 07/16/19   Jerry Lame, MD    Allergies as of 07/16/2019 - Review Complete 05/19/2019  Allergen Reaction  Noted  . Bupropion Itching 08/10/2015    Family History  Problem Relation Age of Onset  . Diabetes Mother   . Congestive Heart Failure Mother   . Heart disease Father   . Coronary artery disease Father   . Diabetes Father     Social History   Socioeconomic History  . Marital status: Married    Spouse name: Not on file  . Number of children: Not on file  . Years of education: Not on file  . Highest education level: Not on file  Occupational History  . Not on file  Social Needs  . Financial resource strain: Not on file  . Food insecurity    Worry: Not on file    Inability: Not on file  . Transportation needs    Medical: Not on file    Non-medical: Not on file  Tobacco Use  . Smoking status: Current Every Day Smoker    Packs/day: 0.75    Years: 15.00    Pack years: 11.25    Types: Cigarettes  . Smokeless tobacco: Never Used  . Tobacco comment: Started in his 75s. QUIT IN 2008, AND STARTED BACK  Substance and Sexual Activity  . Alcohol use: Yes    Alcohol/week: 5.0 standard drinks    Types: 3 Standard drinks or equivalent, 2 Cans of beer per week    Comment: MODERATE USE  . Drug use: No  . Sexual activity: Not on  file  Lifestyle  . Physical activity    Days per week: Not on file    Minutes per session: Not on file  . Stress: Not on file  Relationships  . Social Herbalist on phone: Not on file    Gets together: Not on file    Attends religious service: Not on file    Active member of club or organization: Not on file    Attends meetings of clubs or organizations: Not on file    Relationship status: Not on file  . Intimate partner violence    Fear of current or ex partner: Not on file    Emotionally abused: Not on file    Physically abused: Not on file    Forced sexual activity: Not on file  Other Topics Concern  . Not on file  Social History Narrative  . Not on file    Review of Systems: See HPI, otherwise negative ROS  Physical Exam: BP  131/86   Pulse 70   Temp (!) 97.5 F (36.4 C) (Temporal)   Resp 16   Ht _0  (1.753 m)   Wt 98.8 kg   SpO2 100%   BMI 32.18 kg/m  General:   Alert,  pleasant and cooperative in NAD Head:  Normocephalic and atraumatic. Neck:  Supple; no masses or thyromegaly. Lungs:  Clear throughout to auscultation.    Heart:  Regular rate and rhythm. Abdomen:  Soft, nontender and nondistended. Normal bowel sounds, without guarding, and without rebound.   Neurologic:  Alert and  oriented x4;  grossly normal neurologically.  Impression/Plan: Jerry Walsh is now here to undergo a screening colonoscopy.  Risks, benefits, and alternatives regarding colonoscopy have been reviewed with the patient.  Questions have been answered.  All parties agreeable.

## 2019-08-14 NOTE — Op Note (Signed)
[facility] Gastroenterology Patient Name: Jerry Walsh Procedure Date: 08/14/2019 10:52 AM MRN: [id number] Account #: [id number] Date of Birth: Oct 12, 1961 Admit Type: Outpatient Age: 58 Room: [facility] OR ROOM 01 Gender: Male Note Status: Finalized Procedure:            Colonoscopy Indications:          Screening for colorectal malignant neoplasm Providers:            [name] MD, MD Referring MD:         Kirstie Peri. Caryn Section, MD (Referring MD) Medicines:            Propofol per Anesthesia Complications:        No immediate complications. Procedure:            Pre-Anesthesia Assessment:                       - Prior to the procedure, a History and Physical was                        performed, and patient medications and allergies were                        reviewed. The patient's tolerance of previous                        anesthesia was also reviewed. The risks and benefits of                        the procedure and the sedation options and risks were                        discussed with the patient. All questions were                        answered, and informed consent was obtained. Prior                        Anticoagulants: The patient has taken no previous                        anticoagulant or antiplatelet agents. ASA Grade                        Assessment: II - A patient with mild systemic disease.                        After reviewing the risks and benefits, the patient was                        deemed in satisfactory condition to undergo the                        procedure.                       After obtaining informed consent, the colonoscope was                        passed under direct vision. Throughout the procedure,  the patient's blood pressure, pulse, and oxygen                        saturations were monitored continuously. The was                        introduced through the anus and advanced to the the                     cecum, identified by appendiceal orifice and ileocecal                        valve. The colonoscopy was performed without                        difficulty. The patient tolerated the procedure well.                        The quality of the bowel preparation was excellent. Findings:      The perianal and digital rectal examinations were normal.      Two sessile polyps were found in the sigmoid colon. The polyps were 4 to       6 mm in size. These polyps were removed with a cold snare. Resection and       retrieval were complete.      Multiple small-mouthed diverticula were found in the entire colon.      Non-bleeding internal hemorrhoids were found during retroflexion. The       hemorrhoids were Grade II (internal hemorrhoids that prolapse but reduce       spontaneously). Impression:           - Two 4 to 6 mm polyps in the sigmoid colon, removed                        with a cold snare. Resected and retrieved.                       - Diverticulosis in the entire examined colon.                       - Non-bleeding internal hemorrhoids. Recommendation:       - Discharge patient to home.                       - Resume previous diet.                       - Continue present medications.                       - Repeat colonoscopy in 5 years if polyp adenoma and 10                        years if hyperplastic Procedure Code(s):    --- Professional ---                       [phone], Colonoscopy, flexible; with removal of tumor(s),                        polyp(s), or other lesion(s) by snare technique Diagnosis Code(s):    ---  Professional ---                       Z12.11, Encounter for screening for malignant neoplasm                        of colon                       K63.5, Polyp of colon CPT copyright 2019 American Medical Association. All rights reserved. The codes documented in this report are preliminary and upon coder review may  be revised to meet current  compliance requirements. [name] MD, MD 08/14/2019 11:47:07 AM This report has been signed electronically. Number of Addenda: 0 Note Initiated On: 08/14/2019 10:52 AM Scope Withdrawal Time: 0 hours 7 minutes 0 seconds  Total Procedure Duration: 0 hours 9 minutes 57 seconds  Estimated Blood Loss: Estimated blood loss: none.      [facility]

## 2019-08-14 NOTE — Anesthesia Procedure Notes (Signed)
Date/Time: 08/14/2019 11:28 AM Performed by: Cameron Ali, CRNA Pre-anesthesia Checklist: Patient identified, Emergency Drugs available, Suction available, Timeout performed and Patient being monitored Patient Re-evaluated:Patient Re-evaluated prior to induction Oxygen Delivery Method: Nasal cannula Placement Confirmation: positive ETCO2

## 2019-08-14 NOTE — Transfer of Care (Signed)
Immediate Anesthesia Transfer of Care Note  Patient: Jerry Walsh  Procedure(s) Performed: COLONOSCOPY WITH PROPOFOL (N/A Rectum) POLYPECTOMY (Rectum)  Patient Location: PACU  Anesthesia Type: General  Level of Consciousness: awake, alert  and patient cooperative  Airway and Oxygen Therapy: Patient Spontanous Breathing and Patient connected to supplemental oxygen  Post-op Assessment: Post-op Vital signs reviewed, Patient's Cardiovascular Status Stable, Respiratory Function Stable, Patent Airway and No signs of Nausea or vomiting  Post-op Vital Signs: Reviewed and stable  Complications: No apparent anesthesia complications

## 2019-08-17 ENCOUNTER — Encounter: Payer: Self-pay | Admitting: Gastroenterology

## 2019-08-19 ENCOUNTER — Encounter: Payer: Self-pay | Admitting: Gastroenterology

## 2019-09-01 ENCOUNTER — Other Ambulatory Visit: Payer: Self-pay | Admitting: Family Medicine

## 2019-10-25 IMAGING — MR MR PROSTATE WO/W CM
56 series · 56 of 56 positions shown · IV contrast (Gadavist)
Comparison: None.

CLINICAL DATA: Elevated PSA of 4.9. No biopsy. otherwise benign
workup.

EXAM:
MR PROSTATE WITHOUT AND WITH CONTRAST
TECHNIQUE: Multiplanar multisequence MRI images were obtained of the pelvis
centered about the prostate. Pre and post contrast images were
obtained.
CONTRAST:  9 cc Gadavist

[Series 3: T1 · axial · 8.0mm · 0.74mm/px · 1 of 25 slices shown (1 of 46)]
[im 1/25]
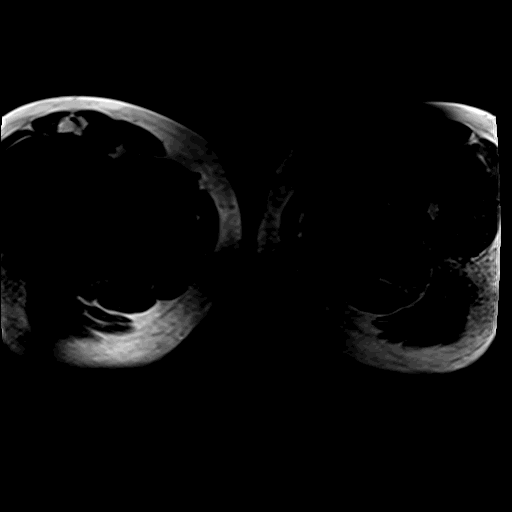

[Series 4: bSSFP fat-sat · axial · 8.0mm · 0.74mm/px · 1 of 25 slices shown]
[im 1/25]
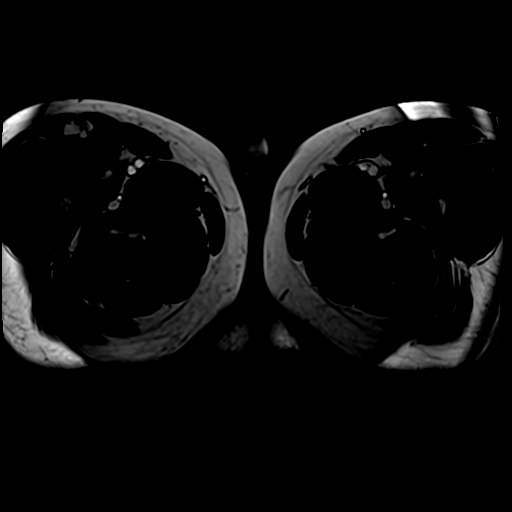

[Series 5: T2 · coronal · 3.0mm · 0.70mm/px · 1 of 30 slices shown (1 of 3)]
[im 1/30]
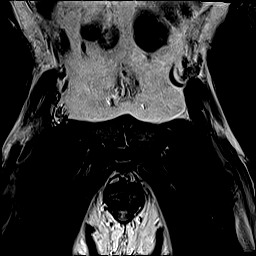

[Series 6: T2 · sagittal · 3.5mm · 0.62mm/px · 1 of 35 slices shown (2 of 3)]
[im 1/35]
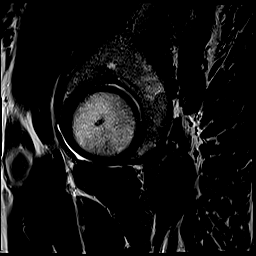

[Series 7: T1 · axial · 3.0mm · 0.35mm/px · 1 of 30 slices shown (2 of 46)]
[im 1/30]
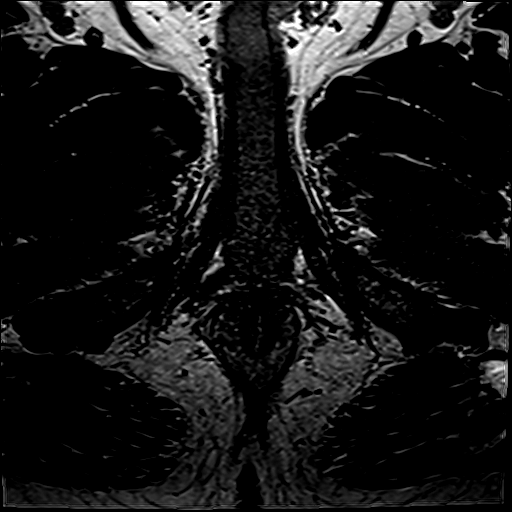

[Series 8: T2 · axial · 3.5mm · 0.56mm/px · 1 of 23 slices shown (3 of 3)]
[im 1/23]
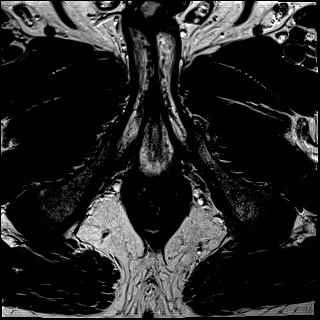

[Series 9: t2_space_tra (id) · axial · 1.0mm · 1.04mm/px · 1 of 80 slices shown]
[im 1/80]
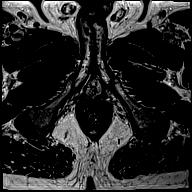

[Series 10: ax dwi_tracew · axial · 3.0mm · 0.78mm/px · 1 of 24 slices shown (1 of 3)]
[im 1/24]
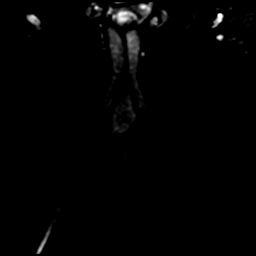

[Series 10: ax dwi_tracew · axial · 3.0mm · 0.78mm/px · 1 of 25 slices shown (2 of 3)]
[im 1/25]
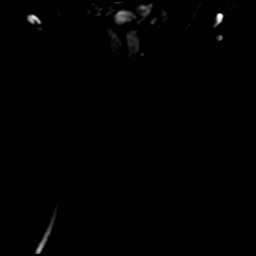

[Series 10: ax dwi_tracew · axial · 3.0mm · 0.78mm/px · 1 of 25 slices shown (3 of 3)]
[im 1/25]
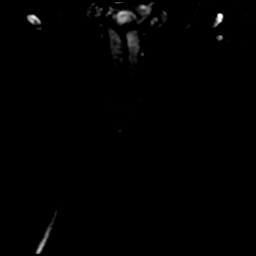

[Series 11: ax dwi_adc · axial · 3.0mm · 0.78mm/px · 1 of 25 slices shown]
[im 1/25]
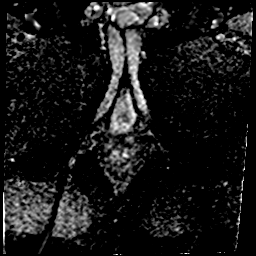

[Series 12: ax dwi_calc_bval · axial · 3.0mm · 0.78mm/px · 1 of 24 slices shown]
[im 1/24]
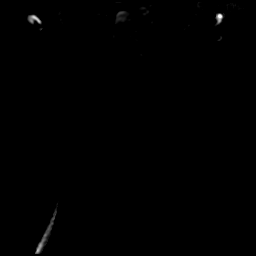

[Series 13: T1 · axial · 3.0mm · 1.15mm/px · 1 of 28 slices shown (3 of 46)]
[im 1/28]
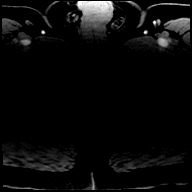

[Series 14: T1 · axial · 3.0mm · 1.15mm/px · 1 of 28 slices shown (4 of 46)]
[im 1/28]
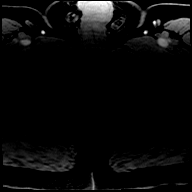

[Series 15: T1 · axial · 3.0mm · 1.15mm/px · 1 of 28 slices shown (5 of 46)]
[im 1/28]
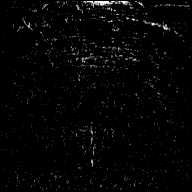

[Series 16: T1 · axial · 3.0mm · 1.15mm/px · 1 of 28 slices shown (6 of 46)]
[im 1/28]
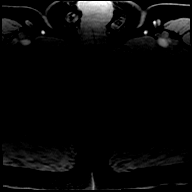

[Series 17: T1 · axial · 3.0mm · 1.15mm/px · 1 of 28 slices shown (7 of 46)]
[im 1/28]
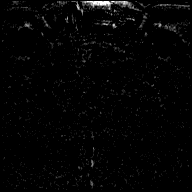

[Series 18: T1 · axial · 3.0mm · 1.15mm/px · 1 of 28 slices shown (8 of 46)]
[im 1/28]
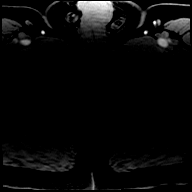

[Series 19: T1 · axial · 3.0mm · 1.15mm/px · 1 of 28 slices shown (9 of 46)]
[im 1/28]
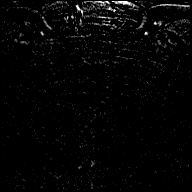

[Series 20: T1 · axial · 3.0mm · 1.15mm/px · 1 of 28 slices shown (10 of 46)]
[im 1/28]
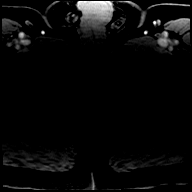

[Series 21: T1 · axial · 3.0mm · 1.15mm/px · 1 of 28 slices shown (11 of 46)]
[im 1/28]
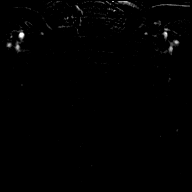

[Series 22: T1 · axial · 3.0mm · 1.15mm/px · 1 of 28 slices shown (12 of 46)]
[im 1/28]
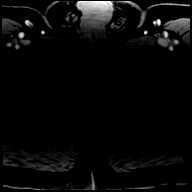

[Series 23: T1 · axial · 3.0mm · 1.15mm/px · 1 of 28 slices shown (13 of 46)]
[im 1/28]
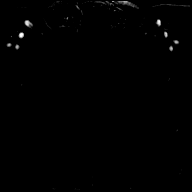

[Series 24: T1 · axial · 3.0mm · 1.15mm/px · 1 of 28 slices shown (14 of 46)]
[im 1/28]
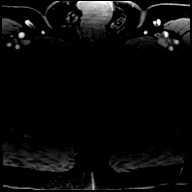

[Series 25: T1 · axial · 3.0mm · 1.15mm/px · 1 of 28 slices shown (15 of 46)]
[im 1/28]
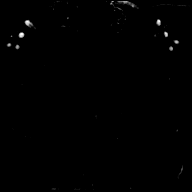

[Series 26: T1 · axial · 3.0mm · 1.15mm/px · 1 of 28 slices shown (16 of 46)]
[im 1/28]
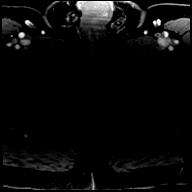

[Series 27: T1 · axial · 3.0mm · 1.15mm/px · 1 of 28 slices shown (17 of 46)]
[im 1/28]
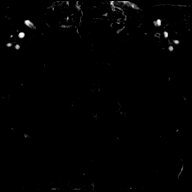

[Series 28: T1 · axial · 3.0mm · 1.15mm/px · 1 of 28 slices shown (18 of 46)]
[im 1/28]
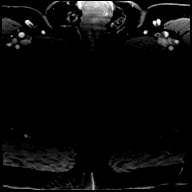

[Series 29: T1 · axial · 3.0mm · 1.15mm/px · 1 of 28 slices shown (19 of 46)]
[im 1/28]
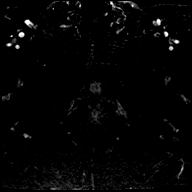

[Series 30: T1 · axial · 3.0mm · 1.15mm/px · 1 of 28 slices shown (20 of 46)]
[im 1/28]
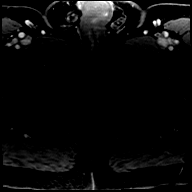

[Series 31: T1 · axial · 3.0mm · 1.15mm/px · 1 of 28 slices shown (21 of 46)]
[im 1/28]
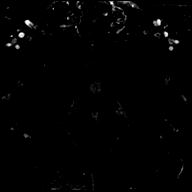

[Series 32: T1 · axial · 3.0mm · 1.15mm/px · 1 of 28 slices shown (22 of 46)]
[im 1/28]
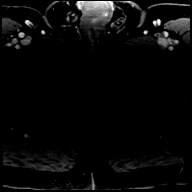

[Series 33: T1 · axial · 3.0mm · 1.15mm/px · 1 of 28 slices shown (23 of 46)]
[im 1/28]
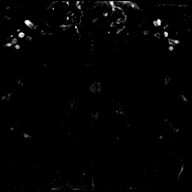

[Series 34: T1 · axial · 3.0mm · 1.15mm/px · 1 of 28 slices shown (24 of 46)]
[im 1/28]
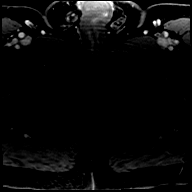

[Series 35: T1 · axial · 3.0mm · 1.15mm/px · 1 of 28 slices shown (25 of 46)]
[im 1/28]
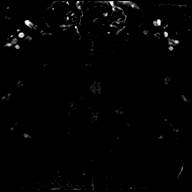

[Series 36: T1 · axial · 3.0mm · 1.15mm/px · 1 of 28 slices shown (26 of 46)]
[im 1/28]
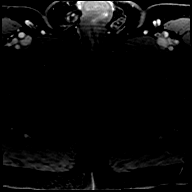

[Series 37: T1 · axial · 3.0mm · 1.15mm/px · 1 of 28 slices shown (27 of 46)]
[im 1/28]
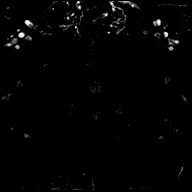

[Series 38: T1 · axial · 3.0mm · 1.15mm/px · 1 of 28 slices shown (28 of 46)]
[im 1/28]
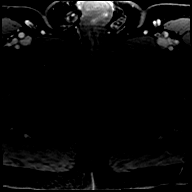

[Series 39: T1 · axial · 3.0mm · 1.15mm/px · 1 of 28 slices shown (29 of 46)]
[im 1/28]
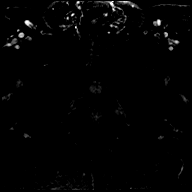

[Series 40: T1 · axial · 3.0mm · 1.15mm/px · 1 of 28 slices shown (30 of 46)]
[im 1/28]
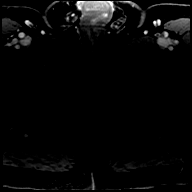

[Series 41: T1 · axial · 3.0mm · 1.15mm/px · 1 of 28 slices shown (31 of 46)]
[im 1/28]
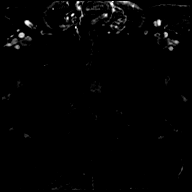

[Series 42: T1 · axial · 3.0mm · 1.15mm/px · 1 of 28 slices shown (32 of 46)]
[im 1/28]
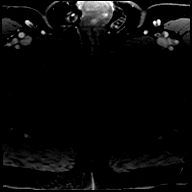

[Series 43: T1 · axial · 3.0mm · 1.15mm/px · 1 of 28 slices shown (33 of 46)]
[im 1/28]
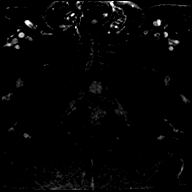

[Series 44: T1 · axial · 3.0mm · 1.15mm/px · 1 of 28 slices shown (34 of 46)]
[im 1/28]
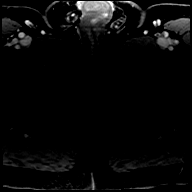

[Series 45: T1 · axial · 3.0mm · 1.15mm/px · 1 of 28 slices shown (35 of 46)]
[im 1/28]
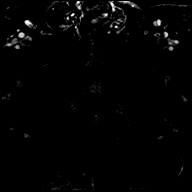

[Series 46: T1 · axial · 3.0mm · 1.15mm/px · 1 of 28 slices shown (36 of 46)]
[im 1/28]
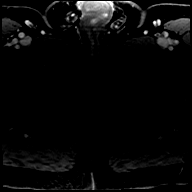

[Series 47: T1 · axial · 3.0mm · 1.15mm/px · 1 of 28 slices shown (37 of 46)]
[im 1/28]
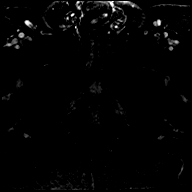

[Series 48: T1 · axial · 3.0mm · 1.15mm/px · 1 of 28 slices shown (38 of 46)]
[im 1/28]
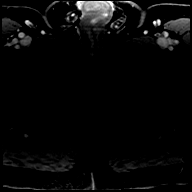

[Series 49: T1 · axial · 3.0mm · 1.15mm/px · 1 of 28 slices shown (39 of 46)]
[im 1/28]
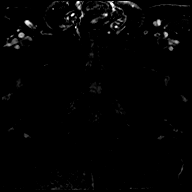

[Series 50: T1 · axial · 3.0mm · 1.15mm/px · 1 of 28 slices shown (40 of 46)]
[im 1/28]
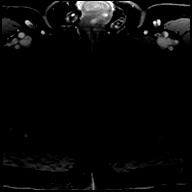

[Series 51: T1 · axial · 3.0mm · 1.15mm/px · 1 of 28 slices shown (41 of 46)]
[im 1/28]
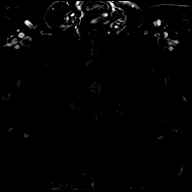

[Series 52: T1 · axial · 3.0mm · 1.15mm/px · 1 of 28 slices shown (42 of 46)]
[im 1/28]
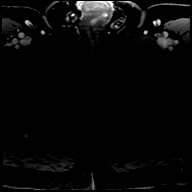

[Series 53: T1 · axial · 3.0mm · 1.15mm/px · 1 of 28 slices shown (43 of 46)]
[im 1/28]
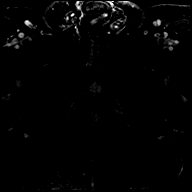

[Series 54: T1 · axial · 3.0mm · 1.15mm/px · 1 of 28 slices shown (44 of 46)]
[im 1/28]
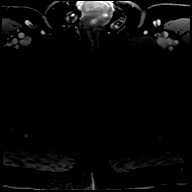

[Series 55: T1 · axial · 3.0mm · 1.15mm/px · 1 of 28 slices shown (45 of 46)]
[im 1/28]
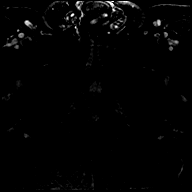

[Series 56: T1 · axial · 3.0mm · 1.15mm/px · 1 of 28 slices shown (46 of 46)]
[im 1/28]
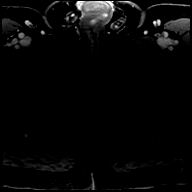

[56 of 56 positions shown; findings below may reference images not displayed]

FINDINGS: Prostate: Moderate central gland enlargement and heterogeneity,
consistent with benign prostatic hyperplasia.

No suspicious central gland nodule. No peripheral zone area of
masslike T2 hypointensity, restricted diffusion, or early
post-contrast enhancement.

Volume: 4.2 x 5.7 x 4.5 cm (volume = 56 cm^3)

Transcapsular spread:  Absent

Seminal vesicle involvement: Absent

Neurovascular bundle involvement: Absent

Pelvic adenopathy: Abscess

Bone metastasis: Absent

Other findings: Precontrast T1 hyperintensity within the bladder,
including on image [DATE]. No significant free fluid. Normal pelvic
bowel loops.
IMPRESSION: 1. Moderate benign prostatic hyperplasia. No findings of macroscopic
or high-grade prostate carcinoma.
2. T1 hyperintensity within the bladder is most likely related to
inadvertent contrast administration during precontrast portion of
exam. Bladder hemorrhage is felt unlikely but cannot be excluded.
Consider correlation with urinalysis.

## 2020-01-06 ENCOUNTER — Ambulatory Visit: Payer: 59 | Attending: Internal Medicine

## 2020-01-06 DIAGNOSIS — Z20822 Contact with and (suspected) exposure to covid-19: Secondary | ICD-10-CM

## 2020-01-07 LAB — SPECIMEN STATUS REPORT

## 2020-01-07 LAB — NOVEL CORONAVIRUS, NAA: SARS-CoV-2, NAA: NOT DETECTED

## 2020-06-10 ENCOUNTER — Ambulatory Visit (INDEPENDENT_AMBULATORY_CARE_PROVIDER_SITE_OTHER): Payer: 59 | Admitting: Family Medicine

## 2020-06-10 ENCOUNTER — Encounter: Payer: Self-pay | Admitting: Family Medicine

## 2020-06-10 ENCOUNTER — Other Ambulatory Visit: Payer: Self-pay

## 2020-06-10 VITALS — BP 158/98 | HR 71 | Temp 96.9°F | Ht 69.0 in | Wt 221.4 lb

## 2020-06-10 DIAGNOSIS — I10 Essential (primary) hypertension: Secondary | ICD-10-CM

## 2020-06-10 DIAGNOSIS — Z125 Encounter for screening for malignant neoplasm of prostate: Secondary | ICD-10-CM

## 2020-06-10 DIAGNOSIS — G4733 Obstructive sleep apnea (adult) (pediatric): Secondary | ICD-10-CM

## 2020-06-10 DIAGNOSIS — Z9989 Dependence on other enabling machines and devices: Secondary | ICD-10-CM

## 2020-06-10 DIAGNOSIS — R739 Hyperglycemia, unspecified: Secondary | ICD-10-CM

## 2020-06-10 DIAGNOSIS — Z23 Encounter for immunization: Secondary | ICD-10-CM | POA: Diagnosis not present

## 2020-06-10 DIAGNOSIS — Z0000 Encounter for general adult medical examination without abnormal findings: Secondary | ICD-10-CM

## 2020-06-10 DIAGNOSIS — Z114 Encounter for screening for human immunodeficiency virus [HIV]: Secondary | ICD-10-CM

## 2020-06-10 DIAGNOSIS — Z87891 Personal history of nicotine dependence: Secondary | ICD-10-CM | POA: Insufficient documentation

## 2020-06-10 NOTE — Progress Notes (Signed)
Complete physical exam   Patient: Jerry Walsh   DOB: 06-18-1961   59 y.o. Male  MRN: [id number] Visit Date: 06/10/2020  Today's healthcare provider: [name], MD   Chief Complaint  Patient presents with  . Annual Exam   [employer] as a scribe for [name], MD.,have documented all relevant documentation on the behalf of [name], MD,as directed by  [name], MD while in the presence of [name], MD.  Subjective    Jerry Walsh is a 59 y.o. male who presents today for a complete physical exam.  He reports consuming a general diet. Gym/ health club routine includes cardio and light weights. He generally feels well. He reports sleeping fairly well. He does not have additional problems to discuss today.  HPI  He is also due for follow up hypertension, elevated blood sugar and OSA. He states his CPAP continues to work well, but he doesn't wear it every nigh. He does feel well rested. He quit smoking in January.  His BP monitor is broken. He states he forgot to take his medications yesterday and had several drinks last night which he thinks is contributing to his high blood pressure today. He did take his medications this morning.   Past Medical History:  Diagnosis Date  . ED (erectile dysfunction) of organic origin 04/25/2006  . Elevated PSA 08/10/2015  . Essential (primary) hypertension 05/18/2008  . Fam hx-ischem heart disease 05/18/2008  . Hyperglycemia 08/10/2015  . Insomnia due to medical condition 04/25/2006  . OSA on CPAP 02/25/2007   Severe sleep apnea, AHI=96, desaturation to 71% on split night 03-06-2007. On CPAP  . Scalp cyst 05/01/2017  . Tobacco abuse 05/18/2015   Past Surgical History:  Procedure Laterality Date  . COLONOSCOPY WITH PROPOFOL N/A 08/14/2019   Procedure: COLONOSCOPY WITH PROPOFOL;  Surgeon: [name], MD;  Location: [facility];  Service: Endoscopy;  Laterality: N/A;  sleep apnea  . DENTAL SURGERY   06/2014  . POLYPECTOMY  08/14/2019   Procedure: POLYPECTOMY;  Surgeon: [name], MD;  Location: [facility];  Service: Endoscopy;;  . [place] History   Socioeconomic History  . Marital status: Married    Spouse name: Not on file  . Number of children: Not on file  . Years of education: Not on file  . Highest education level: Not on file  Occupational History  . Not on file  Tobacco Use  . Smoking status: Former Smoker    Packs/day: 0.75    Years: 15.00    Pack years: 11.25    Types: Cigarettes    Quit date: 11/30/2019    Years since quitting: 0.5  . Smokeless tobacco: Never Used  . Tobacco comment: Started in his 61s. QUIT IN 2008, AND STARTED BACK  Vaping Use  . Vaping Use: Never used  Substance and Sexual Activity  . Alcohol use: Yes    Alcohol/week: 5.0 standard drinks    Types: 2 Cans of beer, 3 Standard drinks or equivalent per week    Comment: MODERATE USE  . Drug use: No  . Sexual activity: Not on file  Other Topics Concern  . Not on file  Social History Narrative  . Not on file   Social Determinants of Health   Financial Resource Strain:   . Difficulty of Paying Living Expenses:   Food Insecurity:   . Worried About Charity fundraiser in the Last  Year:   . Ran Out of Food in the Last Year:   Transportation Needs:   . Film/video editor (Medical):   Marland Kitchen Lack of Transportation (Non-Medical):   Physical Activity:   . Days of Exercise per Week:   . Minutes of Exercise per Session:   Stress:   . Feeling of Stress :   Social Connections:   . Frequency of Communication with Friends and Family:   . Frequency of Social Gatherings with Friends and Family:   . Attends Religious Services:   . Active Member of Clubs or Organizations:   . Attends Archivist Meetings:   Marland Kitchen Marital Status:   Intimate Partner Violence:   . Fear of Current or Ex-Partner:   . Emotionally Abused:   Marland Kitchen Physically Abused:   .  Sexually Abused:    Family Status  Relation Name Status  . Mother  Deceased  . Father  Deceased  . Brother 1 Alive  . Brother 2 Deceased   Family History  Problem Relation Age of Onset  . Diabetes Mother   . Congestive Heart Failure Mother   . Heart disease Father   . Coronary artery disease Father   . Diabetes Father    Allergies  Allergen Reactions  . Bupropion Itching    Patient Care Team: Birdie Sons, MD as PCP - General (Family Medicine)   Medications: Outpatient Medications Prior to Visit  Medication Sig  . amLODipine (NORVASC) 5 MG tablet TAKE 1 TABLET BY MOUTH  DAILY  . ibuprofen (ADVIL,MOTRIN) 200 MG tablet Take 200 mg by mouth every 6 (six) hours as needed.  . Melatonin 10 MG TABS Take by mouth at bedtime as needed.  . tadalafil (CIALIS) 20 MG tablet TAKE 1 TABLET BY MOUTH AS  NEEDED NO MORE THAN 1  TABLET IN A DAY  . valsartan-hydrochlorothiazide (DIOVAN-HCT) 160-12.5 MG tablet TAKE 1 TABLET BY MOUTH  DAILY  . [DISCONTINUED] Na Sulfate-K Sulfate-Mg Sulf (SUPREP BOWEL PREP KIT) 17.5-3.13-1.6 GM/177ML SOLN Take 1 kit by mouth as directed.  . [DISCONTINUED] varenicline (CHANTIX CONTINUING MONTH PAK) 1 MG tablet Take 1 tablet (1 mg total) by mouth 2 (two) times daily.   No facility-administered medications prior to visit.    Review of Systems  Constitutional: Negative.  Negative for chills, diaphoresis and fever.  HENT: Negative.  Negative for congestion, ear discharge, ear pain, hearing loss, nosebleeds, sore throat and tinnitus.   Eyes: Negative.  Negative for photophobia, pain, discharge and redness.  Respiratory: Negative.  Negative for cough, shortness of breath, wheezing and stridor.   Cardiovascular: Negative.  Negative for chest pain, palpitations and leg swelling.  Gastrointestinal: Negative.  Negative for abdominal pain, blood in stool, constipation, diarrhea, nausea and vomiting.  Endocrine: Negative.  Negative for polydipsia.  Genitourinary:  Negative.  Negative for dysuria, flank pain, frequency, hematuria and urgency.  Musculoskeletal: Negative.  Negative for back pain, myalgias and neck pain.  Skin: Negative.  Negative for rash.  Allergic/Immunologic: Negative.  Negative for environmental allergies.  Neurological: Negative.  Negative for dizziness, tremors, seizures, weakness and headaches.  Hematological: Negative.  Does not bruise/bleed easily.  Psychiatric/Behavioral: Negative.  Negative for hallucinations and suicidal ideas. The patient is not nervous/anxious.       Objective    BP (!) 171/106 (BP Location: Right Arm, Patient Position: Sitting, Cuff Size: Large)   Pulse 71   Temp (!) 96.9 F (36.1 C) (Temporal)   Ht _0  (1.753 m)   Wt  221 lb 6.4 oz (100.4 kg)   BMI 32.70 kg/m    Physical Exam  BP (!) 158/98   Pulse 71   Temp (!) 96.9 F (36.1 C) (Temporal)   Ht _0  (1.753 m)   Wt 221 lb 6.4 oz (100.4 kg)   BMI 32.70 kg/m    General Appearance:    Obese male. Alert, cooperative, in no acute distress, appears stated age  Head:    Normocephalic, without obvious abnormality, atraumatic  Eyes:    PERRL, conjunctiva/corneas clear, EOM's intact, fundi    benign, both eyes       Ears:    Normal TM's and external ear canals, both ears  Nose:   Nares normal, septum midline, mucosa normal, no drainage   or sinus tenderness  Throat:   Lips, mucosa, and tongue normal; teeth and gums normal  Neck:   Supple, symmetrical, trachea midline, no adenopathy;       thyroid:  No enlargement/tenderness/nodules; no carotid   bruit or JVD  Back:     Symmetric, no curvature, ROM normal, no CVA tenderness  Lungs:     Clear to auscultation bilaterally, respirations unlabored  Chest wall:    No tenderness or deformity  Heart:    Normal heart rate. Normal rhythm. No murmurs, rubs, or gallops.  S1 and S2 normal  Abdomen:     Soft, non-tender, bowel sounds active all four quadrants,    no masses, no organomegaly  Genitalia:     deferred  Rectal:    deferred  Extremities:   All extremities are intact. No cyanosis or edema  Pulses:   2+ and symmetric all extremities  Skin:   Skin color, texture, turgor normal, no rashes or lesions  Lymph nodes:   Cervical, supraclavicular, and axillary nodes normal  Neurologic:   CNII-XII intact. Normal strength, sensation and reflexes      throughout      Last depression screening scores PHQ 2/9 Scores 06/10/2020 05/01/2017  PHQ - 2 Score 0 0  PHQ- 9 Score - 0   Last fall risk screening Fall Risk  06/10/2020  Falls in the past year? 1  Number falls in past yr: 0  Injury with Fall? 0   Last Audit-C alcohol use screening No flowsheet data found. A score of 3 or more in women, and 4 or more in men indicates increased risk for alcohol abuse, EXCEPT if all of the points are from question 1   No results found for any visits on 06/10/20.  Assessment & Plan    Routine Health Maintenance and Physical Exam  Exercise Activities and Dietary recommendations Goals   None     Immunization History  Administered Date(s) Administered  . DTaP 07/04/2010  . Tdap 05/18/2008, 07/04/2010    Health Maintenance  Topic Date Due  . COVID-19 Vaccine (1) Never done  . HIV Screening  Never done  . INFLUENZA VACCINE  06/26/2020  . TETANUS/TDAP  07/04/2020  . COLONOSCOPY  08/13/2024  . Hepatitis C Screening  Completed    Discussed health benefits of physical activity, and encouraged him to engage in regular exercise appropriate for his age and condition.  1. Annual physical exam Generally doing very mildly overweight. Has stopped smoking. He reports he has had completed Covid vaccine series  2. Essential (primary) hypertension Usually well controlled. Likely elevated today due to missing medication yesterday and alcohol consumption last night. Advised to check BP at home and advise me if it stays  over 140/90 - CBC - Comprehensive metabolic panel - Lipid panel - TSH - EKG  12-Lead  3. Hyperglycemia  - Hemoglobin A1c  4. OSA on CPAP Severe, using CPAP inconsistently. Encouraged to use every night.  5. Prostate cancer screening  - PSA Total (Reflex To Free) ([facility] only)  6. Encounter for screening for HIV  - HIV Antibody (routine testing w rflx)  7. Need for shingles vaccine  - Zoster, Recombinant (Shingrix) #1  9. 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 Portions of this information were initially documented by the CMA and reviewed by me for thoroughness and accuracy.      [name], MD  [facility] [phone] (phone) [phone] (fax)  [place]

## 2020-06-10 NOTE — Patient Instructions (Addendum)
.   Be sure to wear your CPAP every night since you have severe sleep apnea.    Please bring a copy of send a copy of your Covid-19 vaccine for your medical record.    Check your blood pressure once or twice a week and let me know if it stays above 140/90

## 2020-06-13 ENCOUNTER — Other Ambulatory Visit: Payer: Self-pay

## 2020-06-13 ENCOUNTER — Other Ambulatory Visit
Admission: RE | Admit: 2020-06-13 | Discharge: 2020-06-13 | Disposition: A | Discharge status: Home / Self Care | Payer: 59 | Source: Ambulatory Visit | Attending: Family Medicine | Admitting: Family Medicine

## 2020-06-13 DIAGNOSIS — R972 Elevated prostate specific antigen [PSA]: Secondary | ICD-10-CM

## 2020-06-13 LAB — COMPREHENSIVE METABOLIC PANEL
ALT: 29 U/L (ref 0–44)
AST: 23 U/L (ref 15–41)
Albumin: 4.2 g/dL (ref 3.5–5.0)
Alkaline Phosphatase: 62 U/L (ref 38–126)
Anion gap: 8 (ref 5–15)
BUN: 20 mg/dL (ref 6–20)
CO2: 28 mmol/L (ref 22–32)
Calcium: 9.1 mg/dL (ref 8.9–10.3)
Chloride: 101 mmol/L (ref 98–111)
Creatinine, Ser: 1.29 mg/dL — ABNORMAL HIGH (ref 0.61–1.24)
GFR calc Af Amer: >60 mL/min (ref >60)
GFR calc non Af Amer: 60 mL/min — ABNORMAL LOW (ref >60)
Glucose, Bld: 143 mg/dL — ABNORMAL HIGH (ref 70–99)
Potassium: 3.6 mmol/L (ref 3.5–5.1)
Sodium: 137 mmol/L (ref 135–145)
Total Bilirubin: 0.6 mg/dL (ref 0.3–1.2)
Total Protein: 7.7 g/dL (ref 6.5–8.1)

## 2020-06-13 LAB — CBC
HCT: 40 % (ref 39.0–52.0)
Hemoglobin: 14.5 g/dL (ref 13.0–17.0)
MCH: 30.7 pg (ref 26.0–34.0)
MCHC: 36.3 g/dL — ABNORMAL HIGH (ref 30.0–36.0)
MCV: 84.7 fL (ref 80.0–100.0)
Platelets: 389 10*3/uL (ref 150–400)
RBC: 4.72 MIL/uL (ref 4.22–5.81)
RDW: 11.8 % (ref 11.5–15.5)
WBC: 8.2 10*3/uL (ref 4.0–10.5)
nRBC: 0 % (ref 0.0–0.2)

## 2020-06-13 LAB — LIPID PANEL
Cholesterol: 201 mg/dL — ABNORMAL HIGH (ref 0–200)
HDL: 55 mg/dL (ref >40)
LDL Cholesterol: 108 mg/dL — ABNORMAL HIGH (ref 0–99)
Total CHOL/HDL Ratio: 3.7 RATIO
Triglycerides: 189 mg/dL — ABNORMAL HIGH (ref <150)
VLDL: 38 mg/dL (ref 0–40)

## 2020-06-13 LAB — TSH: TSH: 1.26 u[IU]/mL (ref 0.350–4.500)

## 2020-06-13 LAB — HEMOGLOBIN A1C
Hgb A1c MFr Bld: 5.4 % (ref 4.8–5.6)
Mean Plasma Glucose: 108 mg/dL

## 2020-06-13 LAB — HIV ANTIBODY (ROUTINE TESTING W REFLEX): HIV Screen 4th Generation wRfx: NONREACTIVE

## 2020-06-14 ENCOUNTER — Encounter: Payer: Self-pay | Admitting: Urology

## 2020-06-14 LAB — FPSA% REFLEX
% FREE PSA: 10.7 %
PSA, FREE: 0.58 ng/mL

## 2020-06-14 LAB — PSA (REFLEX TO FREE) (SERIAL): Prostate Specific Ag, Serum: 5.4 ng/mL — ABNORMAL HIGH (ref 0.0–4.0)

## 2020-06-22 ENCOUNTER — Encounter: Payer: Self-pay | Admitting: Urology

## 2020-06-22 ENCOUNTER — Other Ambulatory Visit: Payer: Self-pay

## 2020-06-22 ENCOUNTER — Ambulatory Visit (INDEPENDENT_AMBULATORY_CARE_PROVIDER_SITE_OTHER): Payer: 59 | Admitting: Urology

## 2020-06-22 VITALS — BP 152/99 | HR 77 | Ht 69.0 in | Wt 219.0 lb

## 2020-06-22 DIAGNOSIS — R972 Elevated prostate specific antigen [PSA]: Secondary | ICD-10-CM | POA: Diagnosis not present

## 2020-06-22 NOTE — Progress Notes (Signed)
   06/22/2020 3:14 PM   Jerry Walsh [birth year] [id number]  Reason for visit: Follow up elevated PSA  HPI: I saw Jerry Walsh back in urology clinic for PSA follow-up.  I originally met him in June 2020 when he had had a PSA that had ranged from 4.2 in 2016 to 4.9 in February 2020 with 13.5% free.  He opted for a prostate MRI instead of prostate biopsy.  Prostate MRI showed a 56 g prostate with moderate BPH, but no evidence of macroscopic prostate cancer.  He denies any changes in his health since we saw him last year.  PSA this year is relatively stable at 5.4 from 4.91-year ago.   DRE today 50 g prostate, no masses or nodules, firm throughout.  We again reviewed the AUA guidelines regarding PSA screening and the risks and benefits of screening, as well as the shortcomings of prostate MRI.  I recommended a repeat PSA in 6 months, and consideration of biopsy if he continues to have a rise in the PSA.  He is amenable to this plan moving forward.  RTC 6 months with PSA prior  I spent 30 total minutes on the day of the encounter including pre-visit review of the medical record, face-to-face time with the patient, and post visit ordering of labs/imaging/tests.  [name], [facility] [address], [place], [place] [address] [phone]

## 2020-06-22 NOTE — Patient Instructions (Signed)
Prostate Cancer Screening  Prostate cancer screening is a test that is done to check for the presence of prostate cancer in men. The prostate gland is a walnut-sized gland that is located below the bladder and in front of the rectum in males. The function of the prostate is to add fluid to semen during ejaculation. Prostate cancer is the second most common type of cancer in men. Who should have prostate cancer screening?  Screening recommendations vary based on age and other risk factors. Screening is recommended if:  You are older than age 55. If you are age 55-69, talk with your health care provider about your need for screening and how often screening should be done. Because most prostate cancers are slow growing and will not cause death, screening is generally reserved in this age group for men who have a 10-15-year life expectancy.  You are younger than age 55, and you have these risk factors: ? Being a black male or a male of African descent. ? Having a father, brother, or uncle who has been diagnosed with prostate cancer. The risk is higher if your family member's cancer occurred at an early age. Screening is not recommended if:  You are younger than age 40.  You are between the ages of 40 and 54 and you have no risk factors.  You are 60 years of age or older. At this age, the risks that screening can cause are greater than the benefits that it may provide. If you are at high risk for prostate cancer, your health care provider may recommend that you have screenings more often or that you start screening at a younger age. How is screening for prostate cancer done? The recommended prostate cancer screening test is a blood test called the prostate-specific antigen (PSA) test. PSA is a protein that is made in the prostate. As you age, your prostate naturally produces more PSA. Abnormally high PSA levels may be caused by:  Prostate cancer.  An enlarged prostate that is not caused by cancer  (benign prostatic hyperplasia, BPH). This condition is very common in older men.  A prostate gland infection (prostatitis). Depending on the PSA results, you may need more tests, such as:  A physical exam to check the size of your prostate gland.  Blood and imaging tests.  A procedure to remove tissue samples from your prostate gland for testing (biopsy). What are the benefits of prostate cancer screening?  Screening can help to identify cancer at an early stage, before symptoms start and when the cancer can be treated more easily.  There is a small chance that screening may lower your risk of dying from prostate cancer. The chance is small because prostate cancer is a slow-growing cancer, and most men with prostate cancer die from a different cause. What are the risks of prostate cancer screening? The main risk of prostate cancer screening is diagnosing and treating prostate cancer that would never have caused any symptoms or problems. This is called overdiagnosisand overtreatment. PSA screening cannot tell you if your PSA is high due to cancer or a different cause. A prostate biopsy is the only procedure to diagnose prostate cancer. Even the results of a biopsy may not tell you if your cancer needs to be treated. Slow-growing prostate cancer may not need any treatment other than monitoring, so diagnosing and treating it may cause unnecessary stress or other side effects. A prostate biopsy may also cause:  Infection or fever.  A false negative. This is   a result that shows that you do not have prostate cancer when you actually do have prostate cancer. Questions to ask your health care provider  When should I start prostate cancer screening?  What is my risk for prostate cancer?  How often do I need screening?  What type of screening tests do I need?  How do I get my test results?  What do my results mean?  Do I need treatment? Where to find more information  The American Cancer  Society: www.cancer.org  American Urological Association: www.auanet.org Contact a health care provider if:  You have difficulty urinating.  You have pain when you urinate or ejaculate.  You have blood in your urine or semen.  You have pain in your back or in the area of your prostate. Summary  Prostate cancer is a common type of cancer in men. The prostate gland is located below the bladder and in front of the rectum. This gland adds fluid to semen during ejaculation.  Prostate cancer screening may identify cancer at an early stage, when the cancer can be treated more easily.  The prostate-specific antigen (PSA) test is the recommended screening test for prostate cancer.  Discuss the risks and benefits of prostate cancer screening with your health care provider. If you are age 60 or older, the risks that screening can cause are greater than the benefits that it may provide. This information is not intended to replace advice given to you by your health care provider. Make sure you discuss any questions you have with your health care provider. Document Revised: 06/25/2019 Document Reviewed: 06/25/2019 Elsevier Patient Education  2020 Elsevier Inc.  

## 2020-08-26 ENCOUNTER — Other Ambulatory Visit: Payer: Self-pay | Admitting: Family Medicine

## 2020-08-26 ENCOUNTER — Ambulatory Visit (INDEPENDENT_AMBULATORY_CARE_PROVIDER_SITE_OTHER): Payer: 59 | Admitting: Family Medicine

## 2020-08-26 ENCOUNTER — Other Ambulatory Visit: Payer: Self-pay

## 2020-08-26 DIAGNOSIS — Z23 Encounter for immunization: Secondary | ICD-10-CM | POA: Diagnosis not present

## 2020-08-26 MED ORDER — VALSARTAN-HYDROCHLOROTHIAZIDE 160-12.5 MG PO TABS: 1.0000 | ORAL_TABLET | Freq: Every day | ORAL | 0 refills | Status: DC

## 2020-08-26 MED ORDER — AMLODIPINE BESYLATE 5 MG PO TABS: 5.0000 mg | ORAL_TABLET | Freq: Every day | ORAL | 0 refills | Status: DC

## 2020-08-26 NOTE — Progress Notes (Signed)
Nurse visit only. Patient received 2nd shingrix vaccine.

## 2020-08-26 NOTE — Telephone Encounter (Signed)
Medication Refill - Medication: amlodipine, valsartan   Has the patient contacted their pharmacy? Yes.   Pt states that he is completely out of this medication. Please advise.  (Agent: If no, request that the patient contact the pharmacy for the refill.) (Agent: If yes, when and what did the pharmacy advise?)  Preferred Pharmacy (with phone number or street name):  [facility] [place], [address] [place] [address]  [place] [address]  Phone: [phone] Fax: [phone]  Hours: Not open 24 hours     Agent: Please be advised that RX refills may take up to 3 business days. We ask that you follow-up with your pharmacy.

## 2020-08-29 ENCOUNTER — Telehealth: Payer: Self-pay

## 2020-08-29 DIAGNOSIS — I10 Essential (primary) hypertension: Secondary | ICD-10-CM

## 2020-08-29 MED ORDER — VALSARTAN-HYDROCHLOROTHIAZIDE 160-12.5 MG PO TABS: 1.0000 | ORAL_TABLET | Freq: Every day | ORAL | 0 refills | Status: DC

## 2020-08-29 MED ORDER — AMLODIPINE BESYLATE 5 MG PO TABS: 5.0000 mg | ORAL_TABLET | Freq: Every day | ORAL | 0 refills | Status: DC

## 2020-08-29 NOTE — Telephone Encounter (Signed)
Copied from [place] [id number]. Topic: General - Inquiry >> Aug 29, 2020  3:55 PM Greggory Keen D wrote: Reason for CRM: Pt called for status on his medications that was sent over for refills on Saturday.   CB #  [phone]

## 2020-08-29 NOTE — Telephone Encounter (Signed)
Prescriptions resent to pharmacy today.

## 2020-08-29 NOTE — Patient Instructions (Signed)
.   Please review the attached list of medications and notify my office if there are any errors.   . Please bring all of your medications to every appointment so we can make sure that our medication list is the same as yours.   

## 2020-11-11 ENCOUNTER — Other Ambulatory Visit: Payer: Self-pay | Admitting: Family Medicine

## 2020-11-11 DIAGNOSIS — I10 Essential (primary) hypertension: Secondary | ICD-10-CM

## 2020-12-16 ENCOUNTER — Other Ambulatory Visit: Payer: Self-pay | Admitting: Family Medicine

## 2020-12-16 DIAGNOSIS — I10 Essential (primary) hypertension: Secondary | ICD-10-CM

## 2020-12-19 ENCOUNTER — Other Ambulatory Visit: Payer: Self-pay

## 2020-12-19 ENCOUNTER — Other Ambulatory Visit: Payer: Managed Care, Other (non HMO)

## 2020-12-19 ENCOUNTER — Encounter: Payer: Self-pay | Admitting: Family Medicine

## 2020-12-19 ENCOUNTER — Telehealth: Payer: Self-pay | Admitting: Family Medicine

## 2020-12-19 DIAGNOSIS — I10 Essential (primary) hypertension: Secondary | ICD-10-CM

## 2020-12-19 DIAGNOSIS — R972 Elevated prostate specific antigen [PSA]: Secondary | ICD-10-CM

## 2020-12-19 MED ORDER — VALSARTAN-HYDROCHLOROTHIAZIDE 160-12.5 MG PO TABS: 1.0000 | ORAL_TABLET | Freq: Every day | ORAL | 0 refills | Status: DC

## 2020-12-19 MED ORDER — AMLODIPINE BESYLATE 5 MG PO TABS: 5.0000 mg | ORAL_TABLET | Freq: Every day | ORAL | 0 refills | Status: DC

## 2020-12-19 NOTE — Telephone Encounter (Signed)
Medication: valsartan-hydrochlorothiazide (DIOVAN-HCT) 160-12.5 MG tablet [[id number]] , amLODipine (NORVASC) 5 MG tablet [[id number]]   Patient reports that he sent a request to pharmacy and was denied. Patient states that he is out of medication since last week.  Has the patient contacted their pharmacy? YES  (Agent: If no, request that the patient contact the pharmacy for the refill.) (Agent: If yes, when and what did the pharmacy advise?)  Preferred Pharmacy (with phone number or street name): [facility] [place], [place] [address] [place] [address] Phone: [phone] Fax: [phone] Hours: Not open 24 hours    Agent: Please be advised that RX refills may take up to 3 business days. We ask that you follow-up with your pharmacy.

## 2020-12-20 LAB — FPSA% REFLEX
% FREE PSA: 11.9 %
PSA, FREE: 0.81 ng/mL

## 2020-12-20 LAB — PSA TOTAL (REFLEX TO FREE): Prostate Specific Ag, Serum: 6.8 ng/mL — ABNORMAL HIGH (ref 0.0–4.0)

## 2020-12-21 ENCOUNTER — Encounter: Payer: Self-pay | Admitting: Urology

## 2020-12-21 ENCOUNTER — Other Ambulatory Visit: Payer: Self-pay

## 2020-12-21 ENCOUNTER — Ambulatory Visit (INDEPENDENT_AMBULATORY_CARE_PROVIDER_SITE_OTHER): Payer: Managed Care, Other (non HMO) | Admitting: Urology

## 2020-12-21 VITALS — BP 172/105 | HR 77 | Ht 69.0 in | Wt 220.8 lb

## 2020-12-21 DIAGNOSIS — R972 Elevated prostate specific antigen [PSA]: Secondary | ICD-10-CM | POA: Diagnosis not present

## 2020-12-21 NOTE — Progress Notes (Signed)
   12/21/2020 3:04 PM   Jerry Walsh Jan 03, 1961 [id number]  Reason for visit: Follow up elevated PSA  HPI: I saw Jerry Walsh back in urology clinic for PSA follow-up.  I originally met him in June 2020 when he had had a PSA that had ranged from 4.2 in 2016 to 4.9 in February 2020 with 13.5% free.  He opted for a prostate MRI instead of prostate biopsy.  Prostate MRI 05/2019 showed a 56 g prostate with moderate BPH, but no suspicious lesions.  He denies any changes in his health since we saw him last year.    PSA was slightly elevated at 5.4 in July 2021, and he opted for a repeat in 6 months.  Most recent PSA continues to rise at 6.8 with 12% free.  DRE July 2020 with no masses or nodules  We again reviewed the AUA guidelines regarding PSA screening and the risks and benefits of screening, as well as the shortcomings of prostate MRI.  We discussed options including prostate biopsy or repeat PSA in 4 to 6 weeks. We reviewed the implications of an elevated PSA and the uncertainty surrounding it. In general, a man's PSA increases with age and is produced by both normal and cancerous prostate tissue. The differential diagnosis for elevated PSA includes BPH, prostate cancer, infection, recent intercourse/ejaculation, recent urethroscopic manipulation (foley placement/cystoscopy) or trauma, and prostatitis.   Management of an elevated PSA can include observation or prostate biopsy and we discussed this in detail. Our goal is to detect clinically significant prostate cancers, and manage with either active surveillance, surgery, or radiation for localized disease. Risks of prostate biopsy include bleeding, infection (including life threatening sepsis), pain, and lower urinary symptoms. Hematuria, hematospermia, and blood in the stool are all common after biopsy and can persist up to 4 weeks.   Repeat PSA in 6 weeks, call with results.  If persistently elevated, schedule prostate biopsy   [name], MD  [facility] [address], [place], [place] [address] [phone]

## 2020-12-21 NOTE — Patient Instructions (Signed)
Transrectal Prostate Biopsy Patient Education and Post Procedure Instructions    -Definition A prostate biopsy is the removal of a small amount of tissue from the prostate gland. The tissue is examined to determine whether there is cancer.  -Reasons for Procedure A prostate biopsy is usually done after an abnormal finding by: Digital rectal exam Prostate specific antigen (PSA) blood test A prostate biopsy is the only way to find out if cancer cells are present.  -Possible Complications Problems from the procedure are rare, but all procedures have some risk including: Infection Bruising or lengthy bleeding from the rectum, or in urine or semen Difficulty urinating Reactions to anesthesia Factors that may increase the risk of complications include: Smoking History of bleeding disorders or easy bruising Use of any medications, over-the-counter medications, or herbal supplements Sensitivity or allergy to latex, medications, or anesthesia.  -Prior to Procedure Talk to your doctor about your medications. Blood thinning medications including aspirin should be stopped 1 week prior to procedure. If prescribed by your cardiologist we may need approval before stopping medications. Use a Fleets enema 2 hours before the procedure. Can be purchased at your pharmacy. Antibiotics will be administered in the clinic prior to procedure.  Please make sure you eat a light meal prior to coming in for your appointment. This can help prevent lightheadedness during the procedure and upset stomach from antibiotics. Please bring someone with you to the procedure to drive you home.  -Anesthesia Transrectal biopsy: Local anesthesia--Just the area that is being operated on is numbed using an injectable anesthetic.  -Description of the Procedure Transrectal biopsy--Your doctor will insert a small ultrasound device into the rectum. This device will produce sound waves to create an image of the prostate.  These images will help guide placement of the needle. Your doctor will then insert the needle through the wall of the rectum and into the prostate gland. The procedure should take approximately 15-30 minutes.  -Will It Hurt? You may have discomfort and soreness at the biopsy site. Pain and discomfort after the procedure can be managed with medications.  -Postoperative Care When you return home after the procedure, do the following to help ensure a smooth recovery: Stay hydrated. Drink plenty of fluids for the next few days. Avoid difficult physical activity the day and evening of the procedure. Keep in mind that you may see blood in your urine, stool, or semen for several days. Resume any medications that were stopped when you are advised to do so.  After the sample is taken, it will be sent to a pathologist for examination under a microscope. This doctor will analyze the sample for cancer. You will be scheduled for an appointment to discuss results. If cancer is present, your doctor will work with you to develop a treatment plan.   -Call Your Doctor or Seek Immediate Medical Attention It is important to monitor your recovery. Alert your doctor to any problems. If any of the following occur, call your doctor or go to the emergency room: Fever 100.5 or greater within 1 week post procedure go directly to ER Call the office for: Blood in the urine more than 1 week or in semen for more than 6 weeks post-biopsy Pain that you cannot control with the medications you have been given Pain, burning, urgency, or frequency of urination Cough, shortness of breath, or chest pain- if severe go to ER Heavy rectal bleeding or bleeding that lasts more than 1 week after the biopsy If you  have any questions or concerns please contact our office at [phone]  [facility] [address], [address] [place], [place] [address] [phone]   Prostate Cancer Screening  Prostate  cancer screening is a test that is done to check for the presence of prostate cancer in men. The prostate gland is a walnut-sized gland that is located below the bladder and in front of the rectum in males. The function of the prostate is to add fluid to semen during ejaculation. Prostate cancer is the second most common type of cancer in men. Who should have prostate cancer screening?  Screening recommendations vary based on age and other risk factors. Screening is recommended if:  You are older than age 55. If you are age 55-69, talk with your health care provider about your need for screening and how often screening should be done. Because most prostate cancers are slow growing and will not cause death, screening is generally reserved in this age group for men who have a 10-15-year life expectancy.  You are younger than age 55, and you have these risk factors: ? Being a black male or a male of African descent. ? Having a father, brother, or uncle who has been diagnosed with prostate cancer. The risk is higher if your family member's cancer occurred at an early age. Screening is not recommended if:  You are younger than age 40.  You are between the ages of 40 and 54 and you have no risk factors.  You are 70 years of age or older. At this age, the risks that screening can cause are greater than the benefits that it may provide. If you are at high risk for prostate cancer, your health care provider may recommend that you have screenings more often or that you start screening at a younger age. How is screening for prostate cancer done? The recommended prostate cancer screening test is a blood test called the prostate-specific antigen (PSA) test. PSA is a protein that is made in the prostate. As you age, your prostate naturally produces more PSA. Abnormally high PSA levels may be caused by:  Prostate cancer.  An enlarged prostate that is not caused by cancer (benign prostatic hyperplasia, BPH).  This condition is very common in older men.  A prostate gland infection (prostatitis). Depending on the PSA results, you may need more tests, such as:  A physical exam to check the size of your prostate gland.  Blood and imaging tests.  A procedure to remove tissue samples from your prostate gland for testing (biopsy). What are the benefits of prostate cancer screening?  Screening can help to identify cancer at an early stage, before symptoms start and when the cancer can be treated more easily.  There is a small chance that screening may lower your risk of dying from prostate cancer. The chance is small because prostate cancer is a slow-growing cancer, and most men with prostate cancer die from a different cause. What are the risks of prostate cancer screening? The main risk of prostate cancer screening is diagnosing and treating prostate cancer that would never have caused any symptoms or problems. This is called overdiagnosisand overtreatment. PSA screening cannot tell you if your PSA is high due to cancer or a different cause. A prostate biopsy is the only procedure to diagnose prostate cancer. Even the results of a biopsy may not tell you if your cancer needs to be treated. Slow-growing prostate cancer may not need any treatment other than monitoring, so   diagnosing and treating it may cause unnecessary stress or other side effects. A prostate biopsy may also cause:  Infection or fever.  A false negative. This is a result that shows that you do not have prostate cancer when you actually do have prostate cancer. Questions to ask your health care provider  When should I start prostate cancer screening?  What is my risk for prostate cancer?  How often do I need screening?  What type of screening tests do I need?  How do I get my test results?  What do my results mean?  Do I need treatment? Where to find more information  The American Cancer Society: www.cancer.org  American  Urological Association: www.auanet.org Contact a health care provider if:  You have difficulty urinating.  You have pain when you urinate or ejaculate.  You have blood in your urine or semen.  You have pain in your back or in the area of your prostate. Summary  Prostate cancer is a common type of cancer in men. The prostate gland is located below the bladder and in front of the rectum. This gland adds fluid to semen during ejaculation.  Prostate cancer screening may identify cancer at an early stage, when the cancer can be treated more easily.  The prostate-specific antigen (PSA) test is the recommended screening test for prostate cancer.  Discuss the risks and benefits of prostate cancer screening with your health care provider. If you are age 70 or older, the risks that screening can cause are greater than the benefits that it may provide. This information is not intended to replace advice given to you by your health care provider. Make sure you discuss any questions you have with your health care provider. Document Revised: 03/04/2020 Document Reviewed: 06/25/2019 Elsevier Patient Education  2021 Elsevier Inc.  

## 2020-12-30 ENCOUNTER — Other Ambulatory Visit: Payer: Self-pay

## 2020-12-30 ENCOUNTER — Ambulatory Visit (INDEPENDENT_AMBULATORY_CARE_PROVIDER_SITE_OTHER): Payer: Managed Care, Other (non HMO) | Admitting: Family Medicine

## 2020-12-30 ENCOUNTER — Encounter: Payer: Self-pay | Admitting: Family Medicine

## 2020-12-30 VITALS — BP 148/80 | HR 63 | Temp 98.4°F | Resp 16 | Wt 228.0 lb

## 2020-12-30 DIAGNOSIS — R739 Hyperglycemia, unspecified: Secondary | ICD-10-CM

## 2020-12-30 DIAGNOSIS — N529 Male erectile dysfunction, unspecified: Secondary | ICD-10-CM

## 2020-12-30 LAB — POCT GLYCOSYLATED HEMOGLOBIN (HGB A1C)
Est. average glucose Bld gHb Est-mCnc: 123
Hemoglobin A1C: 5.9 % — AB (ref 4.0–5.6)

## 2020-12-30 MED ORDER — TADALAFIL 20 MG PO TABS: ORAL_TABLET | ORAL | 4 refills | Status: DC

## 2020-12-30 NOTE — Progress Notes (Signed)
Established patient visit   Patient: Jerry Walsh   DOB: 21-Jun-1961   60 y.o. Male  MRN: [id number] Visit Date: 12/30/2020  Today's healthcare provider: [name], MD   Chief Complaint  Patient presents with  . Hypertension  . Prediabetes   Subjective    HPI  Hypertension, follow-up  BP Readings from Last 3 Encounters:  12/21/20 (!) 172/105  06/22/20 (!) 152/99  06/10/20 (!) 158/98   Wt Readings from Last 3 Encounters:  12/21/20 220 lb 12.8 oz (100.2 kg)  06/22/20 (!) 219 lb (99.3 kg)  06/10/20 221 lb 6.4 oz (100.4 kg)     He was last seen for hypertension 6 months ago.  BP at that visit was 158/98. Management since that visit includes continuing same medications. Patient was advised to check blood pressure at home and report if it stays over 140/90.  He reports fair compliance with treatment. Patient ran out of blood pressure medications last month. He resumed medications 1 week ago.  He is not having side effects.  He is following a Regular diet. He is exercising. He does not smoke.  Use of agents associated with hypertension: none.   Outside blood pressures are not checked. Symptoms: No chest pain No chest pressure  No palpitations No syncope  No dyspnea No orthopnea  No paroxysmal nocturnal dyspnea No lower extremity edema   Pertinent labs: Lab Results  Component Value Date   CHOL 201 (H) 06/13/2020   HDL 55 06/13/2020   LDLCALC 108 (H) 06/13/2020   TRIG 189 (H) 06/13/2020   CHOLHDL 3.7 06/13/2020   Lab Results  Component Value Date   NA 137 06/13/2020   K 3.6 06/13/2020   CREATININE 1.29 (H) 06/13/2020   GFRNONAA 60 (L) 06/13/2020   GFRAA >60 06/13/2020   GLUCOSE 143 (H) 06/13/2020     The 10-year ASCVD risk score Mikey Bussing DC Jr., et al., 2013) is: 21.9%   ---------------------------------------------------------------------------------------------------   Hyperglycemia, Follow-up  Lab Results  Component Value Date   HGBA1C  5.4 06/13/2020   HGBA1C 6.1 (H) 01/22/2019   HGBA1C 5.9 (H) 05/01/2017   GLUCOSE 143 (H) 06/13/2020   GLUCOSE 159 (H) 01/07/2019   GLUCOSE 118 (H) 05/01/2017    Last seen for for this 6 months ago.  Management since that visit includes continuing lifestyle modifications. Current symptoms include none and have been stable.  Prior visit with dietician: no Current diet: well balanced Current exercise: weightlifting  Pertinent Labs:    Component Value Date/Time   CHOL 201 (H) 06/13/2020 0936   CHOL 210 (H) 01/07/2019 1000   TRIG 189 (H) 06/13/2020 0936   CHOLHDL 3.7 06/13/2020 0936   CREATININE 1.29 (H) 06/13/2020 0936    Wt Readings from Last 3 Encounters:  12/21/20 220 lb 12.8 oz (100.2 kg)  06/22/20 (!) 219 lb (99.3 kg)  06/10/20 221 lb 6.4 oz (100.4 kg)    -----------------------------------------------------------------------------------------     Medications: Outpatient Medications Prior to Visit  Medication Sig  . amLODipine (NORVASC) 5 MG tablet Take 1 tablet (5 mg total) by mouth daily.  Marland Kitchen amoxicillin (AMOXIL) 500 MG tablet Take 500 mg by mouth 3 (three) times daily.  . clobetasol ointment (TEMOVATE) 0.05 % Apply topically 2 (two) times daily.  Marland Kitchen ibuprofen (ADVIL,MOTRIN) 200 MG tablet Take 200 mg by mouth every 6 (six) hours as needed.  . Melatonin 10 MG TABS Take by mouth at bedtime as needed.  . metroNIDAZOLE (FLAGYL) 500 MG tablet  Take 500 mg by mouth 2 (two) times daily.  . tadalafil (CIALIS) 20 MG tablet TAKE 1 TABLET BY MOUTH AS  NEEDED NO MORE THAN 1  TABLET IN A DAY  . valsartan-hydrochlorothiazide (DIOVAN-HCT) 160-12.5 MG tablet Take 1 tablet by mouth daily.   No facility-administered medications prior to visit.    Review of Systems  Constitutional: Negative for appetite change, chills and fever.  Respiratory: Negative for chest tightness, shortness of breath and wheezing.   Cardiovascular: Negative for chest pain and palpitations.   Gastrointestinal: Negative for abdominal pain, nausea and vomiting.       Objective    BP (!) 148/80 (BP Location: Left Arm, Patient Position: Sitting, Cuff Size: Large)   Pulse 63   Temp 98.4 F (36.9 C) (Temporal)   Resp 16   Wt 228 lb (103.4 kg)   BMI 33.67 kg/m     Physical Exam    General: Appearance:    Mildly obese male in no acute distress  Eyes:    PERRL, conjunctiva/corneas clear, EOM's intact       Lungs:     Clear to auscultation bilaterally, respirations unlabored  Heart:    Normal heart rate. Normal rhythm. No murmurs, rubs, or gallops.   MS:   All extremities are intact.   Neurologic:   Awake, alert, oriented x 3. No apparent focal neurological           defect.        Results for orders placed or performed in visit on 12/30/20  POCT HgB A1C  Result Value Ref Range   Hemoglobin A1C 5.9 (A) 4.0 - 5.6 %   Est. average glucose Bld gHb Est-mCnc 123     Assessment & Plan     1. Hyperglycemia Doing well with diet and back on regular exercise routine. Recheck in 5-6 months.   2. Erectile dysfunction, unspecified erectile dysfunction type refill- tadalafil (CIALIS) 20 MG tablet; TAKE 1 TABLET BY MOUTH AS  NEEDED NO MORE THAN 1  TABLET IN A DAY  Dispense: 24 tablet; Refill: 4         The entirety of the information documented in the History of Present Illness, Review of Systems and Physical Exam were personally obtained by me. Portions of this information were initially documented by the CMA and reviewed by me for thoroughness and accuracy.      [name], MD  [facility] [phone] (phone) [phone] (fax)  [place]

## 2021-01-16 ENCOUNTER — Other Ambulatory Visit: Payer: Self-pay | Admitting: Family Medicine

## 2021-01-16 DIAGNOSIS — I10 Essential (primary) hypertension: Secondary | ICD-10-CM

## 2021-01-30 ENCOUNTER — Other Ambulatory Visit: Payer: Self-pay

## 2021-01-31 ENCOUNTER — Other Ambulatory Visit: Payer: Self-pay

## 2021-02-01 ENCOUNTER — Encounter: Payer: Self-pay | Admitting: Urology

## 2021-02-01 ENCOUNTER — Other Ambulatory Visit: Payer: Managed Care, Other (non HMO)

## 2021-02-01 ENCOUNTER — Other Ambulatory Visit: Payer: Self-pay

## 2021-02-01 DIAGNOSIS — R972 Elevated prostate specific antigen [PSA]: Secondary | ICD-10-CM

## 2021-02-02 ENCOUNTER — Telehealth: Payer: Self-pay

## 2021-02-02 LAB — FPSA% REFLEX
% FREE PSA: 11.8 %
PSA, FREE: 0.78 ng/mL

## 2021-02-02 LAB — PSA TOTAL (REFLEX TO FREE): Prostate Specific Ag, Serum: 6.6 ng/mL — ABNORMAL HIGH (ref 0.0–4.0)

## 2021-02-02 NOTE — Telephone Encounter (Signed)
Called pt informed him of the information below. Pt gave verbal understanding. Appt scheduled. Instructions given verbally and via mychart.

## 2021-02-02 NOTE — Telephone Encounter (Signed)
-----   Message from [name], MD sent at 02/02/2021  8:12 AM EST ----- PSA remains elevated at 6.6, would recommend prostate biopsy as discussed in clinic.  Please review instructions and schedule prostate biopsy  Nickolas Madrid, MD 02/02/2021

## 2021-02-18 ENCOUNTER — Other Ambulatory Visit: Payer: Self-pay | Admitting: Family Medicine

## 2021-02-18 DIAGNOSIS — I10 Essential (primary) hypertension: Secondary | ICD-10-CM

## 2021-02-18 NOTE — Telephone Encounter (Signed)
Requested medications are due for refill today yes  Requested medications are on the active medication list yes  Last refill 01/16/21  Last visit 05/2020  Future visit scheduled 05/2021  Notes to clinic Has already had a curtesy refill one month ago, does have appt in July.Marland Kitchen

## 2021-02-20 ENCOUNTER — Ambulatory Visit (INDEPENDENT_AMBULATORY_CARE_PROVIDER_SITE_OTHER): Payer: Managed Care, Other (non HMO) | Admitting: Urology

## 2021-02-20 ENCOUNTER — Other Ambulatory Visit: Payer: Self-pay

## 2021-02-20 ENCOUNTER — Encounter: Payer: Self-pay | Admitting: Urology

## 2021-02-20 VITALS — BP 142/95 | HR 80 | Ht 69.0 in | Wt 214.0 lb

## 2021-02-20 DIAGNOSIS — R972 Elevated prostate specific antigen [PSA]: Secondary | ICD-10-CM

## 2021-02-20 MED ORDER — CEFTRIAXONE SODIUM 500 MG IJ SOLR
1000.0000 mg | Freq: Once | INTRAMUSCULAR | Status: AC
  Administered 2021-02-20: 1000 mg via INTRAMUSCULAR

## 2021-02-20 MED ORDER — LEVOFLOXACIN 500 MG PO TABS
500.0000 mg | ORAL_TABLET | Freq: Once | ORAL | Status: AC
  Administered 2021-02-20: 500 mg via ORAL

## 2021-02-20 NOTE — Patient Instructions (Signed)

## 2021-02-20 NOTE — Addendum Note (Signed)
Addended by: Donalee Citrin on: 02/20/2021 12:19 PM   Modules accepted: Orders

## 2021-02-20 NOTE — Progress Notes (Signed)
   02/20/21  Indication: Elevated PSA, 6.6(11.8% free)-> prostate MRI 05/2019 benign  Prostate Biopsy Procedure   Informed consent was obtained, and we discussed the risks of bleeding and infection/sepsis. A time out was performed to ensure correct patient identity.  Pre-Procedure: - Last PSA Level: 6.6(11.8%) - Ceftriaxone and cipro given for antibiotic prophylaxis - Transrectal Ultrasound performed revealing a 38 gm prostate, PSA density 0.17 - Large median lobe with intravesical protrusion  Procedure: - Prostate block performed using 10 cc 1% lidocaine and biopsies taken from sextant areas, a total of 12 under ultrasound guidance.  Post-Procedure: - Patient tolerated the procedure well - He was counseled to seek immediate medical attention if experiences significant bleeding, fevers, or severe pain - Return in one week to discuss biopsy results  Assessment/ Plan: Will follow up in 1-2 weeks to discuss pathology  Nickolas Madrid, MD 02/20/2021

## 2021-02-22 LAB — SURGICAL PATHOLOGY

## 2021-03-02 ENCOUNTER — Ambulatory Visit (INDEPENDENT_AMBULATORY_CARE_PROVIDER_SITE_OTHER): Payer: Managed Care, Other (non HMO) | Admitting: Urology

## 2021-03-02 ENCOUNTER — Other Ambulatory Visit: Payer: Self-pay

## 2021-03-02 ENCOUNTER — Encounter: Payer: Self-pay | Admitting: Urology

## 2021-03-02 VITALS — BP 135/89 | HR 83 | Ht 69.0 in | Wt 215.0 lb

## 2021-03-02 DIAGNOSIS — R972 Elevated prostate specific antigen [PSA]: Secondary | ICD-10-CM | POA: Diagnosis not present

## 2021-03-02 NOTE — Progress Notes (Signed)
   03/02/2021 3:58 PM   Jerry Walsh 1961/02/11 [id number]  Reason for visit: Discuss prostate biopsy results  HPI: 60 year old male with long history of mildly elevated PSA, with increased to 6.8 with 12% free that prompted a biopsy.  He also had a prostate MRI in February 2020 that showed a 56 g prostate with no suspicious lesions.  He underwent a prostate biopsy on 02/20/2021 for the PSA of 6.6 and this showed a 38 g prostate with a PSA density of 0.17, and there was a large median lobe with intravesical protrusion.  All biopsy showed only BPH and inflammation, with no evidence of malignancy.  We reviewed these results today.  We discussed the 10 to 20% false-negative rate of a prostate biopsy, however very reassuring that he has a negative MRI in the recent past as well.  The inflammation on biopsy also may explain his slight elevation in the PSA.  We discussed the need for ongoing PSA screening.  RTC 6 months for PSA, if stable resume yearly PSA screening   [name], [place] [address] [place], [place] [address] [phone]

## 2021-03-02 NOTE — Patient Instructions (Signed)
Prostate Cancer Screening  Prostate cancer screening is a test that is done to check for the presence of prostate cancer in men. The prostate gland is a walnut-sized gland that is located below the bladder and in front of the rectum in males. The function of the prostate is to add fluid to semen during ejaculation. Prostate cancer is the second most common type of cancer in men. Who should have prostate cancer screening?  Screening recommendations vary based on age and other risk factors. Screening is recommended if:  You are older than age 55. If you are age 55-69, talk with your health care provider about your need for screening and how often screening should be done. Because most prostate cancers are slow growing and will not cause death, screening is generally reserved in this age group for men who have a 10-15-year life expectancy.  You are younger than age 55, and you have these risk factors: ? Being a black male or a male of African descent. ? Having a father, brother, or uncle who has been diagnosed with prostate cancer. The risk is higher if your family member's cancer occurred at an early age. Screening is not recommended if:  You are younger than age 40.  You are between the ages of 40 and 54 and you have no risk factors.  You are 70 years of age or older. At this age, the risks that screening can cause are greater than the benefits that it may provide. If you are at high risk for prostate cancer, your health care provider may recommend that you have screenings more often or that you start screening at a younger age. How is screening for prostate cancer done? The recommended prostate cancer screening test is a blood test called the prostate-specific antigen (PSA) test. PSA is a protein that is made in the prostate. As you age, your prostate naturally produces more PSA. Abnormally high PSA levels may be caused by:  Prostate cancer.  An enlarged prostate that is not caused by cancer  (benign prostatic hyperplasia, BPH). This condition is very common in older men.  A prostate gland infection (prostatitis). Depending on the PSA results, you may need more tests, such as:  A physical exam to check the size of your prostate gland.  Blood and imaging tests.  A procedure to remove tissue samples from your prostate gland for testing (biopsy). What are the benefits of prostate cancer screening?  Screening can help to identify cancer at an early stage, before symptoms start and when the cancer can be treated more easily.  There is a small chance that screening may lower your risk of dying from prostate cancer. The chance is small because prostate cancer is a slow-growing cancer, and most men with prostate cancer die from a different cause. What are the risks of prostate cancer screening? The main risk of prostate cancer screening is diagnosing and treating prostate cancer that would never have caused any symptoms or problems. This is called overdiagnosisand overtreatment. PSA screening cannot tell you if your PSA is high due to cancer or a different cause. A prostate biopsy is the only procedure to diagnose prostate cancer. Even the results of a biopsy may not tell you if your cancer needs to be treated. Slow-growing prostate cancer may not need any treatment other than monitoring, so diagnosing and treating it may cause unnecessary stress or other side effects. A prostate biopsy may also cause:  Infection or fever.  A false negative. This is   a result that shows that you do not have prostate cancer when you actually do have prostate cancer. Questions to ask your health care provider  When should I start prostate cancer screening?  What is my risk for prostate cancer?  How often do I need screening?  What type of screening tests do I need?  How do I get my test results?  What do my results mean?  Do I need treatment? Where to find more information  The American Cancer  Society: www.cancer.org  American Urological Association: www.auanet.org Contact a health care provider if:  You have difficulty urinating.  You have pain when you urinate or ejaculate.  You have blood in your urine or semen.  You have pain in your back or in the area of your prostate. Summary  Prostate cancer is a common type of cancer in men. The prostate gland is located below the bladder and in front of the rectum. This gland adds fluid to semen during ejaculation.  Prostate cancer screening may identify cancer at an early stage, when the cancer can be treated more easily.  The prostate-specific antigen (PSA) test is the recommended screening test for prostate cancer.  Discuss the risks and benefits of prostate cancer screening with your health care provider. If you are age 61 or older, the risks that screening can cause are greater than the benefits that it may provide. This information is not intended to replace advice given to you by your health care provider. Make sure you discuss any questions you have with your health care provider. Document Revised: 03/04/2020 Document Reviewed: 06/25/2019 Elsevier Patient Education  [place].

## 2021-03-25 ENCOUNTER — Other Ambulatory Visit: Payer: Self-pay | Admitting: Family Medicine

## 2021-03-25 DIAGNOSIS — I10 Essential (primary) hypertension: Secondary | ICD-10-CM

## 2021-03-25 NOTE — Telephone Encounter (Signed)
Patient has upcoming appointment and does meet visit criteria. Fails lab protocol- weekend refill- with notes added to next visit- needs labs for refill protocol.

## 2021-04-22 ENCOUNTER — Other Ambulatory Visit: Payer: Self-pay | Admitting: Family Medicine

## 2021-04-22 DIAGNOSIS — I10 Essential (primary) hypertension: Secondary | ICD-10-CM

## 2021-06-14 ENCOUNTER — Other Ambulatory Visit: Payer: Self-pay

## 2021-06-14 ENCOUNTER — Ambulatory Visit (INDEPENDENT_AMBULATORY_CARE_PROVIDER_SITE_OTHER): Payer: Managed Care, Other (non HMO) | Admitting: Family Medicine

## 2021-06-14 ENCOUNTER — Encounter: Payer: Self-pay | Admitting: Family Medicine

## 2021-06-14 VITALS — BP 128/86 | HR 59 | Temp 98.2°F | Resp 18 | Ht 69.0 in | Wt 222.0 lb

## 2021-06-14 DIAGNOSIS — I10 Essential (primary) hypertension: Secondary | ICD-10-CM

## 2021-06-14 DIAGNOSIS — Z87891 Personal history of nicotine dependence: Secondary | ICD-10-CM | POA: Diagnosis not present

## 2021-06-14 DIAGNOSIS — E669 Obesity, unspecified: Secondary | ICD-10-CM

## 2021-06-14 DIAGNOSIS — R7303 Prediabetes: Secondary | ICD-10-CM | POA: Diagnosis not present

## 2021-06-14 DIAGNOSIS — Z0000 Encounter for general adult medical examination without abnormal findings: Secondary | ICD-10-CM | POA: Diagnosis not present

## 2021-06-14 NOTE — Progress Notes (Signed)
Complete physical exam   Patient: Jerry Walsh   DOB: January 17, 1961   60 y.o. Male  MRN: [id number] Visit Date: 06/14/2021  Today's healthcare provider: [name], MD   Chief Complaint  Patient presents with   Annual Exam   Hypertension   Hyperglycemia   Subjective    Jerry Walsh is a 60 y.o. male who presents today for a complete physical exam.  He reports consuming a general diet. Gym/ health club routine includes cardio and strength training. He generally feels fairly well. He reports sleeping fairly well. He does not have additional problems to discuss today.  HPI  Hypertension, follow-up  BP Readings from Last 3 Encounters:  06/14/21 128/86  03/02/21 135/89  02/20/21 (!) 142/95   Wt Readings from Last 3 Encounters:  06/14/21 222 lb (100.7 kg)  03/02/21 215 lb (97.5 kg)  02/20/21 214 lb (97.1 kg)     He was last seen for hypertension 5 months ago.  BP at that visit was 135/89. Management since that visit includes continue same medication.  He reports good compliance with treatment. He is not having side effects.  He is following a Regular diet. He is exercising. He does not smoke.  Use of agents associated with hypertension: none.   Outside blood pressures are not checked. Symptoms: No chest pain No chest pressure  No palpitations No syncope  No dyspnea No orthopnea  No paroxysmal nocturnal dyspnea No lower extremity edema   Pertinent labs: Lab Results  Component Value Date   CHOL 201 (H) 06/13/2020   HDL 55 06/13/2020   LDLCALC 108 (H) 06/13/2020   TRIG 189 (H) 06/13/2020   CHOLHDL 3.7 06/13/2020   Lab Results  Component Value Date   NA 137 06/13/2020   K 3.6 06/13/2020   CREATININE 1.29 (H) 06/13/2020   GFRNONAA 60 (L) 06/13/2020   GFRAA >60 06/13/2020   GLUCOSE 143 (H) 06/13/2020     The 10-year ASCVD risk score Mikey Bussing DC Jr., et al., 2013) is: 13.6%    ---------------------------------------------------------------------------------------------------  Hyperglycemia, Follow-up  Lab Results  Component Value Date   HGBA1C 5.9 (A) 12/30/2020   HGBA1C 5.4 06/13/2020   HGBA1C 6.1 (H) 01/22/2019   GLUCOSE 143 (H) 06/13/2020   GLUCOSE 159 (H) 01/07/2019   GLUCOSE 118 (H) 05/01/2017    Last seen for for this11 months ago.  Management since that visit includes continuing lifestyle modifications. Current symptoms include none and have been stable.  Prior visit with dietician: no Current diet: well balanced Current exercise:  cardio  Pertinent Labs:    Component Value Date/Time   CHOL 201 (H) 06/13/2020 0936   CHOL 210 (H) 01/07/2019 1000   TRIG 189 (H) 06/13/2020 0936   CHOLHDL 3.7 06/13/2020 0936   CREATININE 1.29 (H) 06/13/2020 0936    Wt Readings from Last 3 Encounters:  06/14/21 222 lb (100.7 kg)  03/02/21 215 lb (97.5 kg)  02/20/21 214 lb (97.1 kg)    -----------------------------------------------------------------------------------------   Past Medical History:  Diagnosis Date   ED (erectile dysfunction) of organic origin 04/25/2006   Elevated PSA 08/10/2015   Essential (primary) hypertension 05/18/2008   Fam hx-ischem heart disease 05/18/2008   Hyperglycemia 08/10/2015   Insomnia due to medical condition 04/25/2006   OSA on CPAP 02/25/2007   Severe sleep apnea, AHI=96, desaturation to 71% on split night 03-06-2007. On CPAP   Scalp cyst 05/01/2017   Tobacco abuse 05/18/2015   Past Surgical History:  Procedure Laterality  Date   COLONOSCOPY WITH PROPOFOL N/A 08/14/2019   Procedure: COLONOSCOPY WITH PROPOFOL;  Surgeon: [name], MD;  Location: [place];  Service: Endoscopy;  Laterality: N/A;  sleep apnea   DENTAL SURGERY  06/2014   POLYPECTOMY  08/14/2019   Procedure: POLYPECTOMY;  Surgeon: [name], MD;  Location: [place];  Service: Endoscopy;;   WISDOM TOOTH EXTRACTION  1989   Social  History   Socioeconomic History   Marital status: Married    Spouse name: Not on file   Number of children: Not on file   Years of education: Not on file   Highest education level: Not on file  Occupational History   Not on file  Tobacco Use   Smoking status: Former    Packs/day: 0.75    Types: Cigarettes    Quit date: 11/30/2019    Years since quitting: 1.5   Smokeless tobacco: Never   Tobacco comments:    Started in his 47s. QUIT IN 2008, AND STARTED BACK  Vaping Use   Vaping Use: Never used  Substance and Sexual Activity   Alcohol use: Yes    Alcohol/week: 5.0 standard drinks    Types: 2 Cans of beer, 3 Standard drinks or equivalent per week    Comment: MODERATE USE   Drug use: No   Sexual activity: Yes    Birth control/protection: None  Other Topics Concern   Not on file  Social History Narrative   Not on file   Social Determinants of Health   Financial Resource Strain: Not on file  Food Insecurity: Not on file  Transportation Needs: Not on file  Physical Activity: Not on file  Stress: Not on file  Social Connections: Not on file  Intimate Partner Violence: Not on file   Family Status  Relation Name Status   Mother  Deceased   Father  Deceased   Brother 50 Alive   Brother 2 Deceased   Family History  Problem Relation Age of Onset   Diabetes Mother    Congestive Heart Failure Mother    Heart disease Father    Coronary artery disease Father    Diabetes Father    Allergies  Allergen Reactions   Bupropion Itching    Patient Care Team: Birdie Sons, MD as PCP - General (Family Medicine) [name], MD as Consulting Physician (Urology)   Medications: Outpatient Medications Prior to Visit  Medication Sig   amLODipine (NORVASC) 5 MG tablet TAKE 1 TABLET(5 MG) BY MOUTH DAILY   COVID-19 Specimen Collection KIT TEST AS DIRECTED TODAY   ibuprofen (ADVIL,MOTRIN) 200 MG tablet Take 200 mg by mouth every 6 (six) hours as needed.   tadalafil  (CIALIS) 20 MG tablet TAKE 1 TABLET BY MOUTH AS  NEEDED NO MORE THAN 1  TABLET IN A DAY   valsartan-hydrochlorothiazide (DIOVAN-HCT) 160-12.5 MG tablet TAKE 1 TABLET BY MOUTH DAILY   [DISCONTINUED] clobetasol ointment (TEMOVATE) 0.05 % Apply topically 2 (two) times daily. (Patient not taking: Reported on 06/14/2021)   [DISCONTINUED] Melatonin 10 MG TABS Take by mouth at bedtime as needed. (Patient not taking: Reported on 06/14/2021)   No facility-administered medications prior to visit.    Review of Systems  Constitutional:  Negative for appetite change, chills, fatigue and fever.  HENT:  Negative for congestion, ear pain, hearing loss, nosebleeds and trouble swallowing.   Eyes:  Negative for pain and visual disturbance.  Respiratory:  Negative for cough, chest tightness and shortness of breath.  Cardiovascular:  Negative for chest pain, palpitations and leg swelling.  Gastrointestinal:  Negative for abdominal pain, blood in stool, constipation, diarrhea, nausea and vomiting.  Endocrine: Negative for polydipsia, polyphagia and polyuria.  Genitourinary:  Negative for dysuria and flank pain.  Musculoskeletal:  Negative for arthralgias, back pain, joint swelling, myalgias and neck stiffness.  Skin:  Negative for color change, rash and wound.  Neurological:  Negative for dizziness, tremors, seizures, speech difficulty, weakness, light-headedness and headaches.  Psychiatric/Behavioral:  Negative for behavioral problems, confusion, decreased concentration, dysphoric mood and sleep disturbance. The patient is not nervous/anxious.   All other systems reviewed and are negative.    Objective    BP 128/86 (BP Location: Right Arm, Patient Position: Sitting, Cuff Size: Large)   Pulse (!) 59   Temp 98.2 F (36.8 C) (Temporal)   Resp 18   Ht $R'5\' 9"'EI$  (1.753 m)   Wt 222 lb (100.7 kg)   BMI 32.78 kg/m    Physical Exam    General Appearance:    Mildly obese male. Alert, cooperative, in no acute  distress, appears stated age  Head:    Normocephalic, without obvious abnormality, atraumatic  Eyes:    PERRL, conjunctiva/corneas clear, EOM's intact, fundi    benign, both eyes       Ears:    Normal TM's and external ear canals, both ears  Neck:   Supple, symmetrical, trachea midline, no adenopathy;       thyroid:  No enlargement/tenderness/nodules; no carotid   bruit or JVD  Back:     Symmetric, no curvature, ROM normal, no CVA tenderness  Lungs:     Clear to auscultation bilaterally, respirations unlabored  Chest wall:    No tenderness or deformity  Heart:    Bradycardic. Normal rhythm. No murmurs, rubs, or gallops.  S1 and S2 normal  Abdomen:     Soft, non-tender, bowel sounds active all four quadrants,    no masses, no organomegaly  Genitalia:    deferred  Rectal:    deferred  Extremities:   All extremities are intact. No cyanosis or edema  Pulses:   2+ and symmetric all extremities  Skin:   Skin color, texture, turgor normal, no rashes or lesions  Lymph nodes:   Cervical, supraclavicular, and axillary nodes normal  Neurologic:   CNII-XII intact. Normal strength, sensation and reflexes      throughout     Last depression screening scores PHQ 2/9 Scores 06/14/2021 06/10/2020 05/01/2017  PHQ - 2 Score 0 0 0  PHQ- 9 Score - - 0   Last fall risk screening Fall Risk  06/10/2020  Falls in the past year? 1  Number falls in past yr: 0  Injury with Fall? 0   Last Audit-C alcohol use screening No flowsheet data found. A score of 3 or more in women, and 4 or more in men indicates increased risk for alcohol abuse, EXCEPT if all of the points are from question 1   No results found for any visits on 06/14/21.  Assessment & Plan    Routine Health Maintenance and Physical Exam  Exercise Activities and Dietary recommendations  Goals   None     Immunization History  Administered Date(s) Administered   Influenza,inj,Quad PF,6+ Mos 09/09/2020   Tdap 05/18/2008, 07/04/2010   Zoster  Recombinat (Shingrix) 06/10/2020, 08/26/2020    Health Maintenance  Topic Date Due   COVID-19 Vaccine (1) Never done   TETANUS/TDAP  07/04/2020   INFLUENZA VACCINE  06/26/2021  COLONOSCOPY (Pts 45-56yr Insurance coverage will need to be confirmed)  08/13/2024   Hepatitis C Screening  Completed   HIV Screening  Completed   Zoster Vaccines- Shingrix  Completed   Pneumococcal Vaccine 062626Years old  Aged Out   HPV VACCINES  Aged Out    Discussed health benefits of physical activity, and encouraged him to engage in regular exercise appropriate for his age and condition.  1. Annual physical exam  - EKG 12-Lead  2. Essential (primary) hypertension Well controlled.  Continue current medications.   - CBC - Comprehensive metabolic panel - Lipid panel - EKG 12-Lead  3. Stopped smoking with greater than 20 pack year history Recommend LDCT screening.   4. Prediabetes  - Hemoglobin A1c  5. Class 1 obesity without serious comorbidity in adult, unspecified BMI, unspecified obesity type Diet and exercise.       The entirety of the information documented in the History of Present Illness, Review of Systems and Physical Exam were personally obtained by me. Portions of this information were initially documented by the CMA and reviewed by me for thoroughness and accuracy.     DLelon Huh MD  [phone](phone) [phone](fax)  [place]

## 2021-06-14 NOTE — Patient Instructions (Addendum)
Please review the attached list of medications and notify my office if there are any errors.    Please go to the lab draw station in [address] on the second floor of [facility]  when you are fasting for 8 hours. Normal hours are 8:00am to 11:30am and 1:00pm to 4:00pm Monday through Friday   Please contact your eyecare professional to schedule a routine eye exam. I recommend seeing Dr. Jomarie Longs at [phone] or the [facility] at [phone]   I strongly recommend at least one booster dose (a third shot) of the Covid vaccine for all adults. People over the age of 40 or at high risk for serious Covid infections should have a second booster dose (a fourth shot) 4-6 months after the first booster.    Screening for lung cancer is recommended for people between 1 and 4 years of age who have smoked the equivalent of 1 pack per day for 20 years. Please call our office at [phone] to schedule a low dose CT lung scan for lung cancer screening.

## 2021-06-16 LAB — CBC
Hematocrit: 42.5 % (ref 37.5–51.0)
Hemoglobin: 14.4 g/dL (ref 13.0–17.7)
MCH: 30.1 pg (ref 26.6–33.0)
MCHC: 33.9 g/dL (ref 31.5–35.7)
MCV: 89 fL (ref 79–97)
Platelets: 407 10*3/uL (ref 150–450)
RBC: 4.79 x10E6/uL (ref 4.14–5.80)
RDW: 12.3 % (ref 11.6–15.4)
WBC: 7.8 10*3/uL (ref 3.4–10.8)

## 2021-06-16 LAB — COMPREHENSIVE METABOLIC PANEL
ALT: 30 IU/L (ref 0–44)
AST: 26 IU/L (ref 0–40)
Albumin/Globulin Ratio: 1.8 (ref 1.2–2.2)
Albumin: 4.6 g/dL (ref 3.8–4.8)
Alkaline Phosphatase: 62 IU/L (ref 44–121)
BUN/Creatinine Ratio: 12 (ref 10–24)
BUN: 19 mg/dL (ref 8–27)
Bilirubin Total: 0.5 mg/dL (ref 0.0–1.2)
CO2: 25 mmol/L (ref 20–29)
Calcium: 9.8 mg/dL (ref 8.6–10.2)
Chloride: 101 mmol/L (ref 96–106)
Creatinine, Ser: 1.58 mg/dL — ABNORMAL HIGH (ref 0.76–1.27)
Globulin, Total: 2.5 g/dL (ref 1.5–4.5)
Glucose: 148 mg/dL — ABNORMAL HIGH (ref 65–99)
Potassium: 4.8 mmol/L (ref 3.5–5.2)
Sodium: 141 mmol/L (ref 134–144)
Total Protein: 7.1 g/dL (ref 6.0–8.5)
eGFR: 49 mL/min/{1.73_m2} — ABNORMAL LOW (ref >59)

## 2021-06-16 LAB — LIPID PANEL
Chol/HDL Ratio: 3.7 ratio (ref 0.0–5.0)
Cholesterol, Total: 207 mg/dL — ABNORMAL HIGH (ref 100–199)
HDL: 56 mg/dL (ref >39)
LDL Chol Calc (NIH): 133 mg/dL — ABNORMAL HIGH (ref 0–99)
Triglycerides: 101 mg/dL (ref 0–149)
VLDL Cholesterol Cal: 18 mg/dL (ref 5–40)

## 2021-06-16 LAB — HEMOGLOBIN A1C
Est. average glucose Bld gHb Est-mCnc: 117 mg/dL
Hgb A1c MFr Bld: 5.7 % — ABNORMAL HIGH (ref 4.8–5.6)

## 2021-06-29 ENCOUNTER — Other Ambulatory Visit: Payer: Self-pay | Admitting: Family Medicine

## 2021-06-29 DIAGNOSIS — I10 Essential (primary) hypertension: Secondary | ICD-10-CM

## 2021-06-29 NOTE — Telephone Encounter (Signed)
Requested Prescriptions  Pending Prescriptions Disp Refills  . amLODipine (NORVASC) 5 MG tablet [Pharmacy Med Name: AMLODIPINE BESYLATE '5MG'$  TABLETS] 60 tablet 0    Sig: TAKE 1 TABLET(5 MG) BY MOUTH DAILY     Cardiovascular:  Calcium Channel Blockers Passed - 06/29/2021  3:12 AM      Passed - Last BP in normal range    BP Readings from Last 1 Encounters:  06/14/21 128/86         Passed - Valid encounter within last 6 months    Recent Outpatient Visits          2 weeks ago Annual physical exam   [facility] Birdie Sons, MD   6 months ago Hyperglycemia   [facility] Birdie Sons, MD   10 months ago Need for shingles vaccine   [facility] Birdie Sons, MD   1 year ago Annual physical exam   [facility] Birdie Sons, MD   2 years ago Annual physical exam   [facility] Birdie Sons, MD      Future Appointments            In 5 months Fisher, Kirstie Peri, MD [facility]           . valsartan-hydrochlorothiazide (DIOVAN-HCT) 160-12.5 MG tablet [Pharmacy Med Name: VALSARTAN/HCTZ '160MG'$ /12.'5MG'$  TABLETS] 60 tablet 0    Sig: TAKE 1 TABLET BY MOUTH DAILY     Cardiovascular: ARB + Diuretic Combos Failed - 06/29/2021  3:12 AM      Failed - Cr in normal range and within 180 days    Creatinine, Ser  Date Value Ref Range Status  06/15/2021 1.58 (H) 0.76 - 1.27 mg/dL Final         Passed - K in normal range and within 180 days    Potassium  Date Value Ref Range Status  06/15/2021 4.8 3.5 - 5.2 mmol/L Final         Passed - Na in normal range and within 180 days    Sodium  Date Value Ref Range Status  06/15/2021 141 134 - 144 mmol/L Final         Passed - Ca in normal range and within 180 days    Calcium  Date Value Ref Range Status  06/15/2021 9.8 8.6 - 10.2 mg/dL Final         Passed - Patient is not pregnant      Passed - Last BP in normal range    BP Readings  from Last 1 Encounters:  06/14/21 128/86         Passed - Valid encounter within last 6 months    Recent Outpatient Visits          2 weeks ago Annual physical exam   [facility] Birdie Sons, MD   6 months ago Hyperglycemia   [facility] Birdie Sons, MD   10 months ago Need for shingles vaccine   [facility] Birdie Sons, MD   1 year ago Annual physical exam   [facility] Birdie Sons, MD   2 years ago Annual physical exam   [facility] Birdie Sons, MD      Future Appointments            In 5 months Fisher, Kirstie Peri, MD [facility], [place]

## 2021-09-07 ENCOUNTER — Other Ambulatory Visit: Payer: Self-pay

## 2021-09-07 ENCOUNTER — Other Ambulatory Visit: Payer: Managed Care, Other (non HMO)

## 2021-09-07 DIAGNOSIS — R972 Elevated prostate specific antigen [PSA]: Secondary | ICD-10-CM

## 2021-09-08 LAB — PSA: Prostate Specific Ag, Serum: 6.2 ng/mL — ABNORMAL HIGH (ref 0.0–4.0)

## 2021-09-14 ENCOUNTER — Encounter: Payer: Self-pay | Admitting: Urology

## 2021-09-14 ENCOUNTER — Ambulatory Visit (INDEPENDENT_AMBULATORY_CARE_PROVIDER_SITE_OTHER): Payer: Managed Care, Other (non HMO) | Admitting: Urology

## 2021-09-14 ENCOUNTER — Other Ambulatory Visit: Payer: Self-pay

## 2021-09-14 VITALS — BP 151/89 | HR 68 | Ht 69.0 in | Wt 210.0 lb

## 2021-09-14 DIAGNOSIS — R972 Elevated prostate specific antigen [PSA]: Secondary | ICD-10-CM

## 2021-09-14 NOTE — Progress Notes (Signed)
   09/14/2021 3:38 PM   Jerry Walsh Pant 02/14/1961 [id number]  Reason for visit: Follow up elevated PSA  HPI: 60 year old male with long history of mildly elevated PSA, with increased to 6.8 with 12% free that prompted a biopsy.  He also had a prostate MRI in February 2020 that showed a 56 g prostate with no suspicious lesions.  He underwent a prostate biopsy on 02/20/2021 for the PSA of 6.6 and this showed a 38 g prostate with a PSA density of 0.17, and there was a large median lobe with intravesical protrusion.  All biopsy showed only BPH and inflammation, with no evidence of malignancy.    Repeat PSA October 2022 is stable at 6.2 from 6.6 in March 2022, 6.8 in January 2022, and 5.4 in July 2021.  We discussed the reassuring findings of a negative prostate biopsy and benign prostate MRI.  We discussed the low, but nonzero risk, of missing a clinically significant prostate cancer by deferring further evaluation.  He opts to continue yearly PSA screening which I think is very reasonable.  Consider 4K score in the future if persistently rising PSA   RTC 1 year with PSA reflex to free   [name], [place] [address] [place], [place] [address] [phone]

## 2021-09-14 NOTE — Patient Instructions (Signed)
Prostate Cancer Screening Prostate cancer screening is a test that is done to check for the presence of prostate cancer in men. The prostate gland is a walnut-sized gland that is located below the bladder and in front of the rectum in males. The function of the prostate is to add fluid to semen during ejaculation. Prostate cancer is the second most common type of cancer in men. Who should have prostate cancer screening? Screening recommendations vary based on age and other risk factors. Screening is recommended if: You are older than age 52. If you are age 89-69, talk with your health care provider about your need for screening and how often screening should be done. Because most prostate cancers are slow growing and will not cause death, screening is generally reserved in this age group for men who have a 10-15-year life expectancy. You are younger than age 3, and you have these risk factors: Being a [place] male or a male of African descent. Having a father, brother, or uncle who has been diagnosed with prostate cancer. The risk is higher if your family member's cancer occurred at an early age. Screening is not recommended if: You are younger than age 63. You are between the ages of 38 and 63 and you have no risk factors. You are 71 years of age or older. At this age, the risks that screening can cause are greater than the benefits that it may provide. If you are at high risk for prostate cancer, your health care provider may recommend that you have screenings more often or that you start screening at a younger age. How is screening for prostate cancer done? The recommended prostate cancer screening test is a blood test called the prostate-specific antigen (PSA) test. PSA is a protein that is made in the prostate. As you age, your prostate naturally produces more PSA. Abnormally high PSA levels may be caused by: Prostate cancer. An enlarged prostate that is not caused by cancer (benign prostatic  hyperplasia, BPH). This condition is very common in older men. A prostate gland infection (prostatitis). Depending on the PSA results, you may need more tests, such as: A physical exam to check the size of your prostate gland. Blood and imaging tests. A procedure to remove tissue samples from your prostate gland for testing (biopsy). What are the benefits of prostate cancer screening? Screening can help to identify cancer at an early stage, before symptoms start and when the cancer can be treated more easily. There is a small chance that screening may lower your risk of dying from prostate cancer. The chance is small because prostate cancer is a slow-growing cancer, and most men with prostate cancer die from a different cause. What are the risks of prostate cancer screening? The main risk of prostate cancer screening is diagnosing and treating prostate cancer that would never have caused any symptoms or problems. This is called overdiagnosisand overtreatment. PSA screening cannot tell you if your PSA is high due to cancer or a different cause. A prostate biopsy is the only procedure to diagnose prostate cancer. Even the results of a biopsy may not tell you if your cancer needs to be treated. Slow-growing prostate cancer may not need any treatment other than monitoring, so diagnosing and treating it may cause unnecessary stress or other side effects. Questions to ask your health care provider When should I start prostate cancer screening? What is my risk for prostate cancer? How often do I need screening? What type of screening tests do  I need? How do I get my test results? What do my results mean? Do I need treatment? Where to find more information The American Cancer Society: www.cancer.org American Urological Association: www.auanet.org Contact a health care provider if: You have difficulty urinating. You have pain when you urinate or ejaculate. You have blood in your urine or semen. You  have pain in your back or in the area of your prostate. Summary Prostate cancer is a common type of cancer in men. The prostate gland is located below the bladder and in front of the rectum. This gland adds fluid to semen during ejaculation. Prostate cancer screening may identify cancer at an early stage, when the cancer can be treated more easily. The prostate-specific antigen (PSA) test is the recommended screening test for prostate cancer. Discuss the risks and benefits of prostate cancer screening with your health care provider. If you are age 24 or older, the risks that screening can cause are greater than the benefits that it may provide. This information is not intended to replace advice given to you by your health care provider. Make sure you discuss any questions you have with your health care provider. Document Revised: 01/13/2021 Document Reviewed: 06/25/2019 Elsevier Patient Education  [place].

## 2021-09-18 ENCOUNTER — Other Ambulatory Visit: Payer: Self-pay | Admitting: Family Medicine

## 2021-09-18 DIAGNOSIS — N529 Male erectile dysfunction, unspecified: Secondary | ICD-10-CM

## 2021-09-18 DIAGNOSIS — I10 Essential (primary) hypertension: Secondary | ICD-10-CM

## 2021-09-18 MED ORDER — TADALAFIL 20 MG PO TABS: ORAL_TABLET | ORAL | 3 refills | Status: DC

## 2021-09-18 MED ORDER — VALSARTAN-HYDROCHLOROTHIAZIDE 160-12.5 MG PO TABS: 1.0000 | ORAL_TABLET | Freq: Every day | ORAL | 0 refills | Status: DC

## 2021-09-18 NOTE — Telephone Encounter (Signed)
Requested Prescriptions  Pending Prescriptions Disp Refills  . tadalafil (CIALIS) 20 MG tablet 24 tablet 3    Sig: TAKE 1 TABLET BY MOUTH AS  NEEDED NO MORE THAN 1  TABLET IN A DAY     Urology: Erectile Dysfunction Agents Failed - 09/18/2021  5:15 PM      Failed - Last BP in normal range    BP Readings from Last 1 Encounters:  09/14/21 (!) 151/89         Passed - Valid encounter within last 12 months    Recent Outpatient Visits          3 months ago Annual physical exam   [place], [place]   8 months ago Hyperglycemia   [facility] Birdie Sons, MD   1 year ago Need for shingles vaccine   [facility] Birdie Sons, MD   1 year ago Annual physical exam   [facility] Birdie Sons, MD   2 years ago Annual physical exam   [place], [place]      Future Appointments            In 3 months Fisher, Kirstie Peri, MD [facility]           . valsartan-hydrochlorothiazide (DIOVAN-HCT) 160-12.5 MG tablet 90 tablet 0    Sig: Take 1 tablet by mouth daily.     Cardiovascular: ARB + Diuretic Combos Failed - 09/18/2021  5:15 PM      Failed - Cr in normal range and within 180 days    Creatinine, Ser  Date Value Ref Range Status  06/15/2021 1.58 (H) 0.76 - 1.27 mg/dL Final         Failed - Last BP in normal range    BP Readings from Last 1 Encounters:  09/14/21 (!) 151/89         Passed - K in normal range and within 180 days    Potassium  Date Value Ref Range Status  06/15/2021 4.8 3.5 - 5.2 mmol/L Final         Passed - Na in normal range and within 180 days    Sodium  Date Value Ref Range Status  06/15/2021 141 134 - 144 mmol/L Final         Passed - Ca in normal range and within 180 days    Calcium  Date Value Ref Range Status  06/15/2021 9.8 8.6 - 10.2 mg/dL Final         Passed - Patient is not pregnant      Passed - Valid  encounter within last 6 months    Recent Outpatient Visits          3 months ago Annual physical exam   [facility] Birdie Sons, MD   8 months ago Hyperglycemia   [facility] Birdie Sons, MD   1 year ago Need for shingles vaccine   [facility] Birdie Sons, MD   1 year ago Annual physical exam   [facility] Birdie Sons, MD   2 years ago Annual physical exam   [facility] Birdie Sons, MD      Future Appointments            In 3 months Fisher, Kirstie Peri, MD [facility]           . amLODipine (  NORVASC) 5 MG tablet 60 tablet 0     Cardiovascular:  Calcium Channel Blockers Failed - 09/18/2021  5:15 PM      Failed - Last BP in normal range    BP Readings from Last 1 Encounters:  09/14/21 (!) 151/89         Passed - Valid encounter within last 6 months    Recent Outpatient Visits          3 months ago Annual physical exam   [facility] Birdie Sons, MD   8 months ago Hyperglycemia   [facility] Birdie Sons, MD   1 year ago Need for shingles vaccine   [facility] Birdie Sons, MD   1 year ago Annual physical exam   [facility] Birdie Sons, MD   2 years ago Annual physical exam   [facility] Birdie Sons, MD      Future Appointments            In 3 months Fisher, Kirstie Peri, MD [facility], [place]

## 2021-09-18 NOTE — Telephone Encounter (Signed)
Requested medications are on the active medication list yes  Last visit 06/14/21  Future visit scheduled 12/19/21  Notes to clinic This rx is missing the signature/instructions, please assess.  Requested Prescriptions  Pending Prescriptions Disp Refills   amLODipine (NORVASC) 5 MG tablet 60 tablet 0     Cardiovascular:  Calcium Channel Blockers Failed - 09/18/2021  5:15 PM      Failed - Last BP in normal range    BP Readings from Last 1 Encounters:  09/14/21 (!) 151/89          Passed - Valid encounter within last 6 months    Recent Outpatient Visits           3 months ago Annual physical exam   [facility] Birdie Sons, MD   8 months ago Hyperglycemia   [facility] Birdie Sons, MD   1 year ago Need for shingles vaccine   [facility] Birdie Sons, MD   1 year ago Annual physical exam   [facility] Birdie Sons, MD   2 years ago Annual physical exam   [facility] Birdie Sons, MD       Future Appointments             In 3 months Fisher, Kirstie Peri, MD [facility]            Signed Prescriptions Disp Refills   tadalafil (CIALIS) 20 MG tablet 24 tablet 3    Sig: TAKE 1 TABLET BY MOUTH AS  NEEDED NO MORE THAN 1  TABLET IN A DAY     Urology: Erectile Dysfunction Agents Failed - 09/18/2021  5:15 PM      Failed - Last BP in normal range    BP Readings from Last 1 Encounters:  09/14/21 (!) 151/89          Passed - Valid encounter within last 12 months    Recent Outpatient Visits           3 months ago Annual physical exam   [facility] Birdie Sons, MD   8 months ago Hyperglycemia   [facility] Birdie Sons, MD   1 year ago Need for shingles vaccine   [facility] Birdie Sons, MD   1 year ago Annual physical exam   [facility] Birdie Sons, MD   2 years ago Annual  physical exam   [place], [place]       Future Appointments             In 3 months Fisher, Kirstie Peri, MD [facility]             valsartan-hydrochlorothiazide (DIOVAN-HCT) 160-12.5 MG tablet 90 tablet 0    Sig: Take 1 tablet by mouth daily.     Cardiovascular: ARB + Diuretic Combos Failed - 09/18/2021  5:15 PM      Failed - Cr in normal range and within 180 days    Creatinine, Ser  Date Value Ref Range Status  06/15/2021 1.58 (H) 0.76 - 1.27 mg/dL Final          Failed - Last BP in normal range    BP Readings from Last 1 Encounters:  09/14/21 (!) 151/89          Passed - K in normal range and within 180 days    Potassium  Date  Value Ref Range Status  06/15/2021 4.8 3.5 - 5.2 mmol/L Final          Passed - Na in normal range and within 180 days    Sodium  Date Value Ref Range Status  06/15/2021 141 134 - 144 mmol/L Final          Passed - Ca in normal range and within 180 days    Calcium  Date Value Ref Range Status  06/15/2021 9.8 8.6 - 10.2 mg/dL Final          Passed - Patient is not pregnant      Passed - Valid encounter within last 6 months    Recent Outpatient Visits           3 months ago Annual physical exam   [facility] Birdie Sons, MD   8 months ago Hyperglycemia   [facility] Birdie Sons, MD   1 year ago Need for shingles vaccine   [facility] Birdie Sons, MD   1 year ago Annual physical exam   [facility] Birdie Sons, MD   2 years ago Annual physical exam   [facility] Birdie Sons, MD       Future Appointments             In 3 months Fisher, Kirstie Peri, MD [facility], [place]

## 2021-09-18 NOTE — Telephone Encounter (Signed)
Medication Refill - Medication: amLODipine (NORVASC) 5 MG tablet tadalafil (CIALIS) 20 MG tablet valsartan-hydrochlorothiazide (DIOVAN-HCT) 160-12.5 MG tablet  Has the patient contacted their pharmacy? Yes.   They told him it was denied.  He wants to know why. referred Pharmacy (with phone number or street name): [facility] [place], [place]  Has the patient been seen for an appointment in the last year OR does the patient have an upcoming appointment? Yes.    Agent: Please be advised that RX refills may take up to 3 business days. We ask that you follow-up with your pharmacy.

## 2021-09-19 MED ORDER — AMLODIPINE BESYLATE 5 MG PO TABS: 5.0000 mg | ORAL_TABLET | Freq: Every day | ORAL | 4 refills | Status: DC

## 2021-09-19 NOTE — Telephone Encounter (Signed)
Please review triage nurse note regarding prescription missing directions. The patient's chart states Amlodipine 5mg  take 1 tablet daily. Is this correct or have the directions changed?

## 2021-11-30 ENCOUNTER — Ambulatory Visit: Payer: Self-pay | Admitting: *Deleted

## 2021-11-30 NOTE — Telephone Encounter (Signed)
°  Chief Complaint: eyelid swelling Symptoms: L eyelid swelling, discomfort, drainage Frequency: 3 days Pertinent Negatives: Patient denies itching, burning Disposition: [] ED /[] Urgent Care (no appt availability in office) / [x] Appointment(In office/virtual)/ []  [facility]/ [] Home Care/ [] Refused Recommended Disposition /[] [facility]/ []  Follow-up with PCP Additional Notes: Appt scheduled for 12/05/21 at 1040. Pt was given care advice and symptoms to be on lookout for if worsen. Advised pt he can go to UC or do virtual UC visit if symptoms progress. He verbalized understanding.    Reason for Disposition  [1] MILD eyelid swelling (puffiness) AND [2] persists > 3 days  (Exception: suspect mosquito bites)  Answer Assessment - Initial Assessment Questions 1. ONSET: "When did the swelling start?" (e.g., minutes, hours, days)     3 days 2. LOCATION: "What part of the eyelids is swollen?"     L eyelid 3. SEVERITY: "How swollen is it?"     Swollen and puffy 4. ITCHING: "Is there any itching?" If Yes, ask: "How much?"   (Scale 1-10; mild, moderate or severe)     NO 5. PAIN: "Is the swelling painful to touch?" If Yes, ask: "How painful is it?"   (Scale 1-10; mild, moderate or severe)     1 6. FEVER: "Do you have a fever?" If Yes, ask: "What is it, how was it measured, and when did it start?"      NO 9. OTHER SYMPTOMS: "Do you have any other symptoms?" (e.g., blurred vision, eye discharge, rash, runny nose)     Slight drainage  Protocols used: Eye - Swelling-A-AH

## 2021-11-30 NOTE — Telephone Encounter (Signed)
I attempted to return his call.  Called in earlier today c/o swollen eyelid, soreness and watery. Left a message to call back.

## 2021-12-04 NOTE — Progress Notes (Deleted)
Acute Office Visit  Subjective:    Patient ID: Jerry Walsh, male    DOB: 18-Feb-1961, 61 y.o.   MRN: [id number]  No chief complaint on file.   HPI Patient is in today for of left eye swelling and pain.   Past Medical History:  Diagnosis Date   ED (erectile dysfunction) of organic origin 04/25/2006   Elevated PSA 08/10/2015   Essential (primary) hypertension 05/18/2008   Fam hx-ischem heart disease 05/18/2008   Hyperglycemia 08/10/2015   Insomnia due to medical condition 04/25/2006   OSA on CPAP 02/25/2007   Severe sleep apnea, AHI=96, desaturation to 71% on split night 03-06-2007. On CPAP   Scalp cyst 05/01/2017   Tobacco abuse 05/18/2015    Past Surgical History:  Procedure Laterality Date   COLONOSCOPY WITH PROPOFOL N/A 08/14/2019   Procedure: COLONOSCOPY WITH PROPOFOL;  Surgeon: [name], MD;  Location: [place];  Service: Endoscopy;  Laterality: N/A;  sleep apnea   DENTAL SURGERY  06/2014   POLYPECTOMY  08/14/2019   Procedure: POLYPECTOMY;  Surgeon: [name], MD;  Location: [place];  Service: Endoscopy;;   WISDOM TOOTH EXTRACTION  1989    Family History  Problem Relation Age of Onset   Diabetes Mother    Congestive Heart Failure Mother    Heart disease Father    Coronary artery disease Father    Diabetes Father     Social History   Socioeconomic History   Marital status: Married    Spouse name: Not on file   Number of children: Not on file   Years of education: Not on file   Highest education level: Not on file  Occupational History   Not on file  Tobacco Use   Smoking status: Former    Packs/day: 0.75    Types: Cigarettes    Start date: 83    Quit date: 11/30/2019    Years since quitting: 2.0   Smokeless tobacco: Never   Tobacco comments:    Started in his 92s. QUIT IN 2008, AND STARTED BACK  Vaping Use   Vaping Use: Never used  Substance and Sexual Activity   Alcohol use: Yes    Alcohol/week: 5.0 standard drinks     Types: 2 Cans of beer, 3 Standard drinks or equivalent per week    Comment: MODERATE USE   Drug use: No   Sexual activity: Yes    Birth control/protection: None  Other Topics Concern   Not on file  Social History Narrative   Not on file   Social Determinants of Health   Financial Resource Strain: Not on file  Food Insecurity: Not on file  Transportation Needs: Not on file  Physical Activity: Not on file  Stress: Not on file  Social Connections: Not on file  Intimate Partner Violence: Not on file    Outpatient Medications Prior to Visit  Medication Sig Dispense Refill   amLODipine (NORVASC) 5 MG tablet Take 1 tablet (5 mg total) by mouth daily. 90 tablet 4   hydrOXYzine (ATARAX/VISTARIL) 25 MG tablet Take 25 mg by mouth 2 (two) times daily.     ibuprofen (ADVIL,MOTRIN) 200 MG tablet Take 200 mg by mouth every 6 (six) hours as needed.     tadalafil (CIALIS) 20 MG tablet TAKE 1 TABLET BY MOUTH AS  NEEDED NO MORE THAN 1  TABLET IN A DAY 24 tablet 3   valsartan-hydrochlorothiazide (DIOVAN-HCT) 160-12.5 MG tablet Take 1 tablet by mouth daily. 90 tablet 0  No facility-administered medications prior to visit.    Allergies  Allergen Reactions   Bupropion Itching    Review of Systems     Objective:    Physical Exam  There were no vitals taken for this visit. Wt Readings from Last 3 Encounters:  09/14/21 210 lb (95.3 kg)  06/14/21 222 lb (100.7 kg)  03/02/21 215 lb (97.5 kg)    Health Maintenance Due  Topic Date Due   COVID-19 Vaccine (1) Never done   TETANUS/TDAP  07/04/2020   INFLUENZA VACCINE  06/26/2021    There are no preventive care reminders to display for this patient.   Lab Results  Component Value Date   TSH 1.260 06/13/2020   Lab Results  Component Value Date   WBC 7.8 06/15/2021   HGB 14.4 06/15/2021   HCT 42.5 06/15/2021   MCV 89 06/15/2021   PLT 407 06/15/2021   Lab Results  Component Value Date   NA 141 06/15/2021   K 4.8 06/15/2021    CO2 25 06/15/2021   GLUCOSE 148 (H) 06/15/2021   BUN 19 06/15/2021   CREATININE 1.58 (H) 06/15/2021   BILITOT 0.5 06/15/2021   ALKPHOS 62 06/15/2021   AST 26 06/15/2021   ALT 30 06/15/2021   PROT 7.1 06/15/2021   ALBUMIN 4.6 06/15/2021   CALCIUM 9.8 06/15/2021   ANIONGAP 8 06/13/2020   EGFR 49 (L) 06/15/2021   Lab Results  Component Value Date   CHOL 207 (H) 06/15/2021   Lab Results  Component Value Date   HDL 56 06/15/2021   Lab Results  Component Value Date   LDLCALC 133 (H) 06/15/2021   Lab Results  Component Value Date   TRIG 101 06/15/2021   Lab Results  Component Value Date   CHOLHDL 3.7 06/15/2021   Lab Results  Component Value Date   HGBA1C 5.7 (H) 06/15/2021       Assessment & Plan:   Problem List Items Addressed This Visit   None    No orders of the defined types were placed in this encounter.    Juluis Mire, CMA

## 2021-12-05 ENCOUNTER — Ambulatory Visit: Payer: Managed Care, Other (non HMO) | Admitting: Physician Assistant

## 2021-12-19 ENCOUNTER — Ambulatory Visit (INDEPENDENT_AMBULATORY_CARE_PROVIDER_SITE_OTHER): Payer: Self-pay | Admitting: Family Medicine

## 2021-12-19 ENCOUNTER — Encounter: Payer: Self-pay | Admitting: Family Medicine

## 2021-12-19 ENCOUNTER — Other Ambulatory Visit: Payer: Self-pay

## 2021-12-19 VITALS — BP 129/87 | HR 67 | Temp 98.8°F | Ht 69.5 in | Wt 220.0 lb

## 2021-12-19 DIAGNOSIS — I10 Essential (primary) hypertension: Secondary | ICD-10-CM

## 2021-12-19 DIAGNOSIS — Z87891 Personal history of nicotine dependence: Secondary | ICD-10-CM

## 2021-12-19 DIAGNOSIS — R7303 Prediabetes: Secondary | ICD-10-CM

## 2021-12-19 NOTE — Progress Notes (Signed)
I,Jerry Walsh,acting as a Education administrator for Jerry Huh, MD.,have documented all relevant documentation on the behalf of Jerry Huh, MD,as directed by  Jerry Huh, MD while in the presence of Jerry Huh, MD.  Established patient visit   Patient: Jerry Walsh   DOB: 11-18-1961   61 y.o. Male  MRN: [id number] Visit Date: 12/19/2021  Today's healthcare provider: [name], MD    Subjective    HPI   -09/14/21 was at urology apt and BP elevated Hypertension, follow-up  BP Readings from Last 3 Encounters:  09/14/21 (!) 151/89  06/14/21 128/86  03/02/21 135/89   Wt Readings from Last 3 Encounters:  09/14/21 210 lb (95.3 kg)  06/14/21 222 lb (100.7 kg)  03/02/21 215 lb (97.5 kg)     He was last seen for hypertension 6 months ago.  BP at that visit was 128/86. Management since that visit includes continue current medications.  He reports excellent compliance with treatment. He is not having side effects.  He is following a Low Sodium diet. He is exercising. He does smoke.  Use of agents associated with hypertension: none.   Outside blood pressures are none Symptoms: No chest pain No chest pressure  No palpitations No syncope  No dyspnea No orthopnea  No paroxysmal nocturnal dyspnea Yes lower extremity edema   Pertinent labs: Lab Results  Component Value Date   CHOL 207 (H) 06/15/2021   HDL 56 06/15/2021   LDLCALC 133 (H) 06/15/2021   TRIG 101 06/15/2021   CHOLHDL 3.7 06/15/2021   Lab Results  Component Value Date   NA 141 06/15/2021   K 4.8 06/15/2021   CREATININE 1.58 (H) 06/15/2021   EGFR 49 (L) 06/15/2021   GLUCOSE 148 (H) 06/15/2021   TSH 1.260 06/13/2020     The 10-year ASCVD risk score (Arnett DK, et al., 2019) is: 18.2%   ---------------------------------------------------------------------------------------------------   Medications: Outpatient Medications Prior to Visit  Medication Sig   amLODipine (NORVASC) 5 MG tablet  Take 1 tablet (5 mg total) by mouth daily.   hydrOXYzine (ATARAX/VISTARIL) 25 MG tablet Take 25 mg by mouth 2 (two) times daily.   ibuprofen (ADVIL,MOTRIN) 200 MG tablet Take 200 mg by mouth every 6 (six) hours as needed.   tadalafil (CIALIS) 20 MG tablet TAKE 1 TABLET BY MOUTH AS  NEEDED NO MORE THAN 1  TABLET IN A DAY   valsartan-hydrochlorothiazide (DIOVAN-HCT) 160-12.5 MG tablet Take 1 tablet by mouth daily.   No facility-administered medications prior to visit.         Objective    BP 129/87 (BP Location: Right Arm, Patient Position: Sitting, Cuff Size: Normal)    Pulse 67    Temp 98.8 F (37.1 C) (Oral)    Ht 5' 9.5" (1.765 m)    Wt 220 lb (99.8 kg)    SpO2 98%    BMI 32.02 kg/m  {Show previous vital signs (optional):23777}  Physical Exam  General appearance: Mildly obese male, cooperative and in no acute distress Head: Normocephalic, without obvious abnormality, atraumatic Respiratory: Respirations even and unlabored, normal respiratory rate Extremities: All extremities are intact.  Skin: Skin color, texture, turgor normal. No rashes seen  Psych: Appropriate mood and affect. Neurologic: Mental status: Alert, oriented to person, place, and time, thought content appropriate.     Assessment & Plan    1. Essential (primary) hypertension Fairly well controlled. He is noted to have reduced eGFR on last labs. Encouraged to push water intake and  continue regularly exercise and healthy diet choices.  - Renal function panel  2. Prediabetes  - Hemoglobin A1c  3. Stopped smoking with greater than 20 pack year history Quit in 2021.  - Ambulatory Referral Lung Cancer Screening [facility]       The entirety of the information documented in the History of Present Illness, Review of Systems and Physical Exam were personally obtained by me. Portions of this information were initially documented by the CMA and reviewed by me for thoroughness and accuracy.     Jerry Huh,  MD  [facility] [phone] (phone) [phone] (fax)  [place]

## 2021-12-20 ENCOUNTER — Encounter: Payer: Self-pay | Admitting: Family Medicine

## 2021-12-20 LAB — RENAL FUNCTION PANEL
Albumin: 4.8 g/dL (ref 3.8–4.8)
BUN/Creatinine Ratio: 16 (ref 10–24)
BUN: 24 mg/dL (ref 8–27)
CO2: 26 mmol/L (ref 20–29)
Calcium: 9.9 mg/dL (ref 8.6–10.2)
Chloride: 103 mmol/L (ref 96–106)
Creatinine, Ser: 1.52 mg/dL — ABNORMAL HIGH (ref 0.76–1.27)
Glucose: 123 mg/dL — ABNORMAL HIGH (ref 70–99)
Phosphorus: 3.5 mg/dL (ref 2.8–4.1)
Potassium: 4.5 mmol/L (ref 3.5–5.2)
Sodium: 143 mmol/L (ref 134–144)
eGFR: 52 mL/min/{1.73_m2} — ABNORMAL LOW (ref >59)

## 2021-12-20 LAB — HEMOGLOBIN A1C
Est. average glucose Bld gHb Est-mCnc: 114 mg/dL
Hgb A1c MFr Bld: 5.6 % (ref 4.8–5.6)

## 2021-12-24 ENCOUNTER — Other Ambulatory Visit: Payer: Self-pay | Admitting: Family Medicine

## 2021-12-24 DIAGNOSIS — I10 Essential (primary) hypertension: Secondary | ICD-10-CM

## 2021-12-24 NOTE — Telephone Encounter (Signed)
Requested Prescriptions  Pending Prescriptions Disp Refills   valsartan-hydrochlorothiazide (DIOVAN-HCT) 160-12.5 MG tablet [Pharmacy Med Name: VALSARTAN/HCTZ 160MG /12.5MG  TABLETS] 90 tablet 0    Sig: TAKE 1 TABLET BY MOUTH DAILY     Cardiovascular: ARB + Diuretic Combos Failed - 12/24/2021  3:13 AM      Failed - Cr in normal range and within 180 days    Creatinine, Ser  Date Value Ref Range Status  12/19/2021 1.52 (H) 0.76 - 1.27 mg/dL Final         Passed - K in normal range and within 180 days    Potassium  Date Value Ref Range Status  12/19/2021 4.5 3.5 - 5.2 mmol/L Final         Passed - Na in normal range and within 180 days    Sodium  Date Value Ref Range Status  12/19/2021 143 134 - 144 mmol/L Final         Passed - Ca in normal range and within 180 days    Calcium  Date Value Ref Range Status  12/19/2021 9.9 8.6 - 10.2 mg/dL Final         Passed - Patient is not pregnant      Passed - Last BP in normal range    BP Readings from Last 1 Encounters:  12/19/21 129/87         Passed - Valid encounter within last 6 months    Recent Outpatient Visits          5 days ago Essential (primary) hypertension   [facility] Birdie Sons, MD   6 months ago Annual physical exam   [facility] Birdie Sons, MD   11 months ago Hyperglycemia   [facility] Birdie Sons, MD   1 year ago Need for shingles vaccine   [facility] Birdie Sons, MD   1 year ago Annual physical exam   [facility] Birdie Sons, MD      Future Appointments            In 5 months Fisher, Kirstie Peri, MD [facility], [place]

## 2021-12-28 ENCOUNTER — Other Ambulatory Visit: Payer: Self-pay | Admitting: Family Medicine

## 2021-12-28 DIAGNOSIS — Z87891 Personal history of nicotine dependence: Secondary | ICD-10-CM

## 2022-02-12 ENCOUNTER — Telehealth: Payer: Self-pay | Admitting: Acute Care

## 2022-02-12 NOTE — Telephone Encounter (Signed)
Left VM regarding [facility] referral.   ?

## 2022-03-01 ENCOUNTER — Encounter: Payer: Self-pay | Admitting: Family Medicine

## 2022-03-07 NOTE — Telephone Encounter (Signed)
Need more information.

## 2022-05-14 ENCOUNTER — Encounter: Payer: Self-pay | Admitting: Family Medicine

## 2022-05-14 ENCOUNTER — Ambulatory Visit (INDEPENDENT_AMBULATORY_CARE_PROVIDER_SITE_OTHER): Payer: Managed Care, Other (non HMO) | Admitting: Family Medicine

## 2022-05-14 VITALS — BP 136/90 | HR 75 | Temp 98.7°F | Resp 16 | Wt 219.0 lb

## 2022-05-14 DIAGNOSIS — M7651 Patellar tendinitis, right knee: Secondary | ICD-10-CM | POA: Diagnosis not present

## 2022-05-14 MED ORDER — MELOXICAM 15 MG PO TABS: 15.0000 mg | ORAL_TABLET | Freq: Every day | ORAL | 0 refills | Status: DC

## 2022-05-14 NOTE — Progress Notes (Signed)
     I,Roshena L Chambers,acting as a scribe for [name], MD.,have documented all relevant documentation on the behalf of [name], MD,as directed by  [name], MD while in the presence of [name], MD.   Established patient visit   Patient: Jerry Walsh   DOB: 03-19-1961   61 y.o. Male  MRN: [id number] Visit Date: 05/14/2022  Today's healthcare provider: [name], MD   Chief Complaint  Patient presents with   Knee Pain   Subjective    Knee Pain  Incident onset: 6 days ago. The pain is present in the right knee. The pain has been Worsening since onset. Associated symptoms include an inability to bear weight and muscle weakness. Associated symptoms comments: Right knee swelling.   Patient states knee pain started 1 day after walking on a treadmill.  ---------------------------------------------------------------------------------------------------   Medications: Outpatient Medications Prior to Visit  Medication Sig   amLODipine (NORVASC) 5 MG tablet Take 1 tablet (5 mg total) by mouth daily.   tadalafil (CIALIS) 20 MG tablet TAKE 1 TABLET BY MOUTH AS  NEEDED NO MORE THAN 1  TABLET IN A DAY   valsartan-hydrochlorothiazide (DIOVAN-HCT) 160-12.5 MG tablet TAKE 1 TABLET BY MOUTH DAILY   hydrOXYzine (ATARAX/VISTARIL) 25 MG tablet Take 25 mg by mouth 2 (two) times daily. (Patient not taking: Reported on 05/14/2022)   [DISCONTINUED] ibuprofen (ADVIL,MOTRIN) 200 MG tablet Take 200 mg by mouth every 6 (six) hours as needed. (Patient not taking: Reported on 05/14/2022)   No facility-administered medications prior to visit.    Review of Systems  Constitutional:  Negative for appetite change, chills and fever.  Respiratory:  Negative for chest tightness, shortness of breath and wheezing.   Cardiovascular:  Negative for chest pain and palpitations.  Gastrointestinal:  Negative for abdominal pain, nausea and vomiting.  Musculoskeletal:  Positive for arthralgias.        Objective    BP 136/90 (BP Location: Right Arm, Patient Position: Sitting, Cuff Size: Large)   Pulse 75   Temp 98.7 F (37.1 C) (Oral)   Resp 16   Wt 219 lb (99.3 kg)   SpO2 100% Comment: room air  BMI 31.88 kg/m    Physical Exam   Tender along distal quad and proximal patella tendon. Pain reproduced with passive knee flexion >90 degrees and extension against resistance. No swelling or erythema.     Assessment & Plan     1. Patellar tendinitis of right knee Relative rest. OTC acetaminophen prn.  OTC dicloflenac gel prn. Consider orthopedic referral.      The entirety of the information documented in the History of Present Illness, Review of Systems and Physical Exam were personally obtained by me. Portions of this information were initially documented by the CMA and reviewed by me for thoroughness and accuracy.     [name], MD  [facility] [phone] (phone) [phone] (fax)  [place]

## 2022-06-05 ENCOUNTER — Other Ambulatory Visit: Payer: Self-pay | Admitting: Family Medicine

## 2022-06-07 ENCOUNTER — Encounter: Payer: Self-pay | Admitting: Physician Assistant

## 2022-06-07 ENCOUNTER — Ambulatory Visit: Payer: Self-pay | Admitting: *Deleted

## 2022-06-07 ENCOUNTER — Ambulatory Visit (INDEPENDENT_AMBULATORY_CARE_PROVIDER_SITE_OTHER): Payer: Managed Care, Other (non HMO) | Admitting: Physician Assistant

## 2022-06-07 VITALS — BP 127/97 | HR 85 | Temp 98.7°F | Resp 16 | Wt 222.2 lb

## 2022-06-07 DIAGNOSIS — M7651 Patellar tendinitis, right knee: Secondary | ICD-10-CM

## 2022-06-07 DIAGNOSIS — M25561 Pain in right knee: Secondary | ICD-10-CM

## 2022-06-07 MED ORDER — MELOXICAM 15 MG PO TABS: 15.0000 mg | ORAL_TABLET | Freq: Every day | ORAL | 0 refills | Status: DC

## 2022-06-07 MED ORDER — OMEPRAZOLE 20 MG PO CPDR: 20.0000 mg | DELAYED_RELEASE_CAPSULE | Freq: Every day | ORAL | 3 refills | Status: DC

## 2022-06-07 NOTE — Telephone Encounter (Signed)
Reason for Disposition  [1] MODERATE pain (e.g., interferes with normal activities, limping) AND [2] present > 3 days  Answer Assessment - Initial Assessment Questions 1. LOCATION and RADIATION: "Where is the pain located?"      Having pain in right knee.   I was seen by Dr. Caryn Section 3-4 weeks ago.  Knee was swollen and hurt to move it.  He prescribed Mobic which helped.  Since coming off of it the pain started back again and is getting worse.   My knee is stiffer.   It hurts to bend it.   2. QUALITY: "What does the pain feel like?"  (e.g., sharp, dull, aching, burning)     Stiff when I move it. 3. SEVERITY: "How bad is the pain?" "What does it keep you from doing?"   (Scale 1-10; or mild, moderate, severe)   -  MILD (1-3): doesn't interfere with normal activities    -  MODERATE (4-7): interferes with normal activities (e.g., work or school) or awakens from sleep, limping    -  SEVERE (8-10): excruciating pain, unable to do any normal activities, unable to walk     Moderate 4. ONSET: "When did the pain start?" "Does it come and go, or is it there all the time?"     I need to see Dr. Caryn Section again or see a specialist.    5. RECURRENT: "Have you had this pain before?" If Yes, ask: "When, and what happened then?"     Yes 6. SETTING: "Has there been any recent work, exercise or other activity that involved that part of the body?"      No 7. AGGRAVATING FACTORS: "What makes the knee pain worse?" (e.g., walking, climbing stairs, running)     Climbing the steps. 8. ASSOCIATED SYMPTOMS: "Is there any swelling or redness of the knee?"     A little swollen 9. OTHER SYMPTOMS: "Do you have any other symptoms?" (e.g., chest pain, difficulty breathing, fever, calf pain)     No 10. PREGNANCY: "Is there any chance you are pregnant?" "When was your last menstrual period?"       N/A  Protocols used: Knee Pain-A-AH

## 2022-06-07 NOTE — Progress Notes (Signed)
I,Adien Kimmel Robinson,acting as a Education administrator for [employer], PA-C.,have documented all relevant documentation on the behalf of Mardene Speak, PA-C,as directed by  [employer], PA-C while in the presence of [employer], PA-C.   Established patient visit   Patient: Jerry Walsh   DOB: July 31, 1961   61 y.o. Male  MRN: [id number] Visit Date: 06/07/2022  Today's healthcare provider: Mardene Speak, PA-C   Chief Complaint  Patient presents with   Knee Pain   Subjective    Patient presents for continued right knee pain.  Reports it is also slightly swollen, stiff and painful with movement.   Seen by Dr. Caryn Section on 05-14-22 and prescribed Mobic x 14 days.  Since coming off it the pain started back again and is getting worse.  Onset of pain after walking on treadmill. Patient has been wearing OTC soft brace with no relief.     Medications: Outpatient Medications Prior to Visit  Medication Sig   amLODipine (NORVASC) 5 MG tablet Take 1 tablet (5 mg total) by mouth daily.   hydrOXYzine (ATARAX/VISTARIL) 25 MG tablet Take 25 mg by mouth 2 (two) times daily.   tadalafil (CIALIS) 20 MG tablet TAKE 1 TABLET BY MOUTH AS  NEEDED NO MORE THAN 1  TABLET IN A DAY   valsartan-hydrochlorothiazide (DIOVAN-HCT) 160-12.5 MG tablet TAKE 1 TABLET BY MOUTH DAILY   meloxicam (MOBIC) 15 MG tablet Take 1 tablet (15 mg total) by mouth daily.   No facility-administered medications prior to visit.    Review of Systems  All other systems reviewed and are negative.  See HPI    Objective    BP (!) 127/97 (BP Location: Left Arm, Patient Position: Sitting, Cuff Size: Normal)   Pulse 85   Temp 98.7 F (37.1 C) (Oral)   Resp 16   Wt 222 lb 3.2 oz (100.8 kg)   SpO2 99%   BMI 32.34 kg/m    Physical Exam Vitals reviewed.  Constitutional:      General: He is not in acute distress.    Appearance: Normal appearance. He is not diaphoretic.  HENT:     Head: Normocephalic and atraumatic.  Eyes:      General: No scleral icterus.    Conjunctiva/sclera: Conjunctivae normal.  Cardiovascular:     Rate and Rhythm: Normal rate and regular rhythm.     Pulses: Normal pulses.     Heart sounds: Normal heart sounds. No murmur heard. Pulmonary:     Effort: Pulmonary effort is normal. No respiratory distress.     Breath sounds: Normal breath sounds. No wheezing or rhonchi.  Musculoskeletal:     Cervical back: Neck supple.     Right lower leg: No edema.     Left lower leg: No edema.  Lymphadenopathy:     Cervical: No cervical adenopathy.  Skin:    General: Skin is warm and dry.     Findings: No rash.  Neurological:     Mental Status: He is alert and oriented to person, place, and time. Mental status is at baseline.  Psychiatric:        Mood and Affect: Mood normal.        Behavior: Behavior normal.      No results found for any visits on 06/07/22.  Assessment & Plan     1. Patellar tendinitis of right knee Referral to PT - omeprazole (PRILOSEC) 20 MG capsule; Take 1 capsule (20 mg total) by mouth daily.  Dispense: 30 capsule; Refill:  3 - meloxicam (MOBIC) 15 MG tablet; Take 1 tablet (15 mg total) by mouth daily.  Dispense: 14 tablet; Refill: 0 - AMB referral to orthopedics  2. Right knee pain, unspecified chronicity Ice, compression sleeve and keeping the knee raised - AMB referral to orthopedics   FU with Dr. Caryn Section    The patient was advised to call back or seek an in-person evaluation if the symptoms worsen or if the condition fails to improve as anticipated.  I discussed the assessment and treatment plan with the patient. The patient was provided an opportunity to ask questions and all were answered. The patient agreed with the plan and demonstrated an understanding of the instructions.  The entirety of the information documented in the History of Present Illness, Review of Systems and Physical Exam were personally obtained by me. Portions of this information were initially  documented by the CMA and reviewed by me for thoroughness and accuracy.  Portions of this note were created using dictation software and may contain typographical errors.     Mardene Speak, PA-C  [facility] [phone] (phone) [phone] (fax)  [place]

## 2022-06-07 NOTE — Telephone Encounter (Signed)
  Chief Complaint: right knee still hurting after taking the Mobic Symptoms: painful, slightly swollen, stiff, hurts to move it Frequency: since finishing the Mobic Dr. Caryn Section prescribed. Pertinent Negatives: Patient denies N/A Disposition: '[]'$ ED /'[]'$ Urgent Care (no appt availability in office) / '[x]'$ Appointment(In office/virtual)/ '[]'$  [place] [facility]/ '[]'$ Home Care/ '[]'$ Refused Recommended Disposition /'[]'$ [place] [facility]/ '[]'$  Follow-up with PCP Additional Notes: Appt made for today with Mardene Speak, PA-C at 1:00.

## 2022-06-15 ENCOUNTER — Telehealth: Payer: Self-pay

## 2022-06-15 NOTE — Telephone Encounter (Signed)
Copied from [place] [id number]. Topic: General - Other >> Jun 15, 2022 11:17 AM Leitha Schuller wrote: Reason for CRM: Patient giving insurance information, patient was made aware to bring card to next visit  [facility] Member ID: [id number]  Group #: [id number]

## 2022-06-20 ENCOUNTER — Encounter: Payer: Managed Care, Other (non HMO) | Admitting: Family Medicine

## 2022-06-29 ENCOUNTER — Other Ambulatory Visit: Payer: Self-pay | Admitting: Family Medicine

## 2022-06-29 DIAGNOSIS — I10 Essential (primary) hypertension: Secondary | ICD-10-CM

## 2022-07-10 NOTE — Patient Instructions (Signed)
.   Please review the attached list of medications and notify my office if there are any errors.   . Please bring all of your medications to every appointment so we can make sure that our medication list is the same as yours.   

## 2022-08-13 ENCOUNTER — Ambulatory Visit: Payer: Self-pay | Admitting: Physician Assistant

## 2022-08-13 NOTE — Progress Notes (Deleted)
    Argentina Ponder Zakary Kimura,acting as a Education administrator for [employer], PA-C.,have documented all relevant documentation on the behalf of Mardene Speak, PA-C,as directed by  [employer], PA-C while in the presence of [employer], PA-C.    Established patient visit   Patient: Jerry Walsh   DOB: 1961-05-30   61 y.o. Male  MRN: [id number] Visit Date: 08/13/2022  Today's healthcare provider: Mardene Speak, PA-C   No chief complaint on file.  Subjective    HPI  Patient is a 61 year old male who presents for evaluation of left hand pain and swelling.  Medications: Outpatient Medications Prior to Visit  Medication Sig   amLODipine (NORVASC) 5 MG tablet Take 1 tablet (5 mg total) by mouth daily.   hydrOXYzine (ATARAX/VISTARIL) 25 MG tablet Take 25 mg by mouth 2 (two) times daily.   meloxicam (MOBIC) 15 MG tablet Take 1 tablet (15 mg total) by mouth daily.   omeprazole (PRILOSEC) 20 MG capsule Take 1 capsule (20 mg total) by mouth daily.   tadalafil (CIALIS) 20 MG tablet TAKE 1 TABLET BY MOUTH AS  NEEDED NO MORE THAN 1  TABLET IN A DAY   valsartan-hydrochlorothiazide (DIOVAN-HCT) 160-12.5 MG tablet TAKE 1 TABLET BY MOUTH DAILY   No facility-administered medications prior to visit.    Review of Systems  {Labs  Heme  Chem  Endocrine  Serology  Results Review (optional):23779}   Objective    There were no vitals taken for this visit. {Show previous vital signs (optional):23777}  Physical Exam  ***  No results found for any visits on 08/13/22.  Assessment & Plan     ***  No follow-ups on file.      {provider attestation***:1}   Mardene Speak, Hershal Coria  [facility] [phone] (phone) [phone] (fax)  [place]

## 2022-09-06 ENCOUNTER — Other Ambulatory Visit: Payer: 59

## 2022-09-06 DIAGNOSIS — R972 Elevated prostate specific antigen [PSA]: Secondary | ICD-10-CM

## 2022-09-07 LAB — FPSA% REFLEX
% FREE PSA: 14.7 %
PSA, FREE: 0.81 ng/mL

## 2022-09-07 LAB — PSA TOTAL (REFLEX TO FREE): Prostate Specific Ag, Serum: 5.5 ng/mL — ABNORMAL HIGH (ref 0.0–4.0)

## 2022-09-12 ENCOUNTER — Ambulatory Visit: Payer: Managed Care, Other (non HMO) | Admitting: Urology

## 2022-09-19 ENCOUNTER — Ambulatory Visit: Payer: Self-pay | Admitting: Urology

## 2022-09-27 ENCOUNTER — Other Ambulatory Visit: Payer: Self-pay | Admitting: Family Medicine

## 2022-09-27 ENCOUNTER — Ambulatory Visit (INDEPENDENT_AMBULATORY_CARE_PROVIDER_SITE_OTHER): Payer: 59 | Admitting: Urology

## 2022-09-27 VITALS — Ht 69.5 in | Wt 222.0 lb

## 2022-09-27 DIAGNOSIS — I10 Essential (primary) hypertension: Secondary | ICD-10-CM

## 2022-09-27 DIAGNOSIS — N529 Male erectile dysfunction, unspecified: Secondary | ICD-10-CM

## 2022-09-27 DIAGNOSIS — Z125 Encounter for screening for malignant neoplasm of prostate: Secondary | ICD-10-CM | POA: Diagnosis not present

## 2022-09-27 MED ORDER — TADALAFIL 20 MG PO TABS: 20.0000 mg | ORAL_TABLET | Freq: Every day | ORAL | 3 refills | Status: DC | PRN

## 2022-09-27 NOTE — Progress Notes (Signed)
   09/27/2022 3:07 PM   Samanyu Tinnell Hellard 09-29-1961 [id number]  Reason for visit: Follow up elevated PSA, ED  HPI: 61 year old male with long history of mildly elevated PSA, with increased to 6.8 with 12% free that prompted a biopsy in March 2022.  This showed a 38 g prostate with a PSA density of 0.17, and there was a large median lobe, all biopsies showed BPH and inflammation with no evidence of malignancy.  He also had a prostate MRI in February 2020 that showed a 56 g prostate with no suspicious lesions.    Repeat PSAs have been essentially stable since that time, most recently 5.5(15% free) in October 2023.  We discussed the reassuring findings of a negative prostate biopsy and benign prostate MRI, as well as the stability of the PSA over the last few years.  We discussed the low, but non-zero risk, of missing a clinically significant prostate cancer by deferring further evaluation.  He opts to continue yearly PSA screening which I think is very reasonable.  Consider 4K score in the future if persistently rising PSA.  He was prescribed Cialis 20 mg on demand by PCP last year which has worked well.  He is interested in a refill that medication.  I sent that to [email or web address].   Cialis 20 mg as needed for ED refilled RTC 1 year PSA prior   [name], MD  [place] [address], [place], [place] [address] [phone]

## 2022-09-27 NOTE — Patient Instructions (Signed)
Your prescription for cialis was sent to [email or web address]. Go to the website and create a free account, and your prescription will be mailed to you  Prostate Cancer Screening  Prostate cancer screening is testing that is done to check for the presence of prostate cancer in men. The prostate gland is a walnut-sized gland that is located below the bladder and in front of the rectum in males. The function of the prostate is to add fluid to semen during ejaculation. Prostate cancer is one of the most common types of cancer in men. Who should have prostate cancer screening? Screening recommendations vary based on age and other risk factors, as well as between the professional organizations who make the recommendations. In general, screening is recommended if: You are age 41 to 46 and have an average risk for prostate cancer. You should talk with your health care provider about your need for screening and how often screening should be done. Because most prostate cancers are slow growing and will not cause death, screening in this age group is generally reserved for men who have a 9- to 15-year life expectancy. You are younger than age 47, and you have these risk factors: Having a father, brother, or uncle who has been diagnosed with prostate cancer. The risk is higher if your family member's cancer occurred at an early age or if you have multiple family members with prostate cancer at an early age. Being a male who is [place] or is of [place] or sub-Saharan African descent. In general, screening is not recommended if: You are younger than age 72. You are between the ages of 54 and 3 and you have no risk factors. You are 66 years of age or older. At this age, the risks that screening can cause are greater than the benefits that it may provide. If you are at high risk for prostate cancer, your health care provider may recommend that you have screenings more often or that you start screening at a younger  age. How is screening for prostate cancer done? The recommended prostate cancer screening test is a blood test called the prostate-specific antigen (PSA) test. PSA is a protein that is made in the prostate. As you age, your prostate naturally produces more PSA. Abnormally high PSA levels may be caused by: Prostate cancer. An enlarged prostate that is not caused by cancer (benign prostatic hyperplasia, or BPH). This condition is very common in older men. A prostate gland infection (prostatitis) or urinary tract infection. Certain medicines such as male hormones (like testosterone) or other medicines that raise testosterone levels. A rectal exam may be done as part of prostate cancer screening to help provide information about the size of your prostate gland. When a rectal exam is performed, it should be done after the PSA level is drawn to avoid any effect on the results. Depending on the PSA results, you may need more tests, such as: A physical exam to check the size of your prostate gland, if not done as part of screening. Blood and imaging tests. A procedure to remove tissue samples from your prostate gland for testing (biopsy). This is the only way to know for certain if you have prostate cancer. What are the benefits of prostate cancer screening? Screening can help to identify cancer at an early stage, before symptoms start and when the cancer can be treated more easily. There is a small chance that screening may lower your risk of dying from prostate cancer. The chance is small because  prostate cancer is a slow-growing cancer, and most men with prostate cancer die from a different cause. What are the risks of prostate cancer screening? The main risk of prostate cancer screening is diagnosing and treating prostate cancer that would never have caused any symptoms or problems. This is called overdiagnosisand overtreatment. PSA screening cannot tell you if your PSA is high due to cancer or a  different cause. A prostate biopsy is the only procedure to diagnose prostate cancer. Even the results of a biopsy may not tell you if your cancer needs to be treated. Slow-growing prostate cancer may not need any treatment other than monitoring, so diagnosing and treating it may cause unnecessary stress or other side effects. Questions to ask your health care provider When should I start prostate cancer screening? What is my risk for prostate cancer? How often do I need screening? What type of screening tests do I need? How do I get my test results? What do my results mean? Do I need treatment? Where to find more information The American Cancer Society: www.cancer.org American Urological Association: www.auanet.org Contact a health care provider if: You have difficulty urinating. You have pain when you urinate or ejaculate. You have blood in your urine or semen. You have pain in your back or in the area of your prostate. Summary Prostate cancer is a common type of cancer in men. The prostate gland is located below the bladder and in front of the rectum. This gland adds fluid to semen during ejaculation. Prostate cancer screening may identify cancer at an early stage, when the cancer can be treated more easily and is less likely to have spread to other areas of the body. The prostate-specific antigen (PSA) test is the recommended screening test for prostate cancer, but it has associated risks. Discuss the risks and benefits of prostate cancer screening with your health care provider. If you are age 19 or older, the risks that screening can cause are greater than the benefits that it may provide. This information is not intended to replace advice given to you by your health care provider. Make sure you discuss any questions you have with your health care provider. Document Revised: 05/08/2021 Document Reviewed: 05/08/2021 Elsevier Patient Education  Jerry Walsh.

## 2022-09-27 NOTE — Telephone Encounter (Signed)
Requested Prescriptions  Pending Prescriptions Disp Refills   amLODipine (NORVASC) 5 MG tablet [Pharmacy Med Name: AMLODIPINE BESYLATE '5MG'$  TABLETS] 90 tablet 0    Sig: TAKE 1 TABLET(5 MG) BY MOUTH DAILY     Cardiovascular: Calcium Channel Blockers 2 Failed - 09/27/2022  9:03 AM      Failed - Last BP in normal range    BP Readings from Last 1 Encounters:  06/07/22 (!) 127/97         Passed - Last Heart Rate in normal range    Pulse Readings from Last 1 Encounters:  06/07/22 85         Passed - Valid encounter within last 6 months    Recent Outpatient Visits           3 months ago Patellar tendinitis of right knee   [place], [place], PA-C   4 months ago Patellar tendinitis of right knee   [facility] Birdie Sons, MD   9 months ago Essential (primary) hypertension   [facility], Kirstie Peri, MD   1 year ago Annual physical exam   [facility] Birdie Sons, MD   1 year ago Hyperglycemia   [place], Kirstie Peri, MD       Future Appointments             Today [name], [place]

## 2022-12-25 ENCOUNTER — Encounter: Payer: Self-pay | Admitting: Physician Assistant

## 2022-12-25 ENCOUNTER — Ambulatory Visit (INDEPENDENT_AMBULATORY_CARE_PROVIDER_SITE_OTHER): Payer: 59 | Admitting: Physician Assistant

## 2022-12-25 VITALS — BP 162/86 | HR 80 | Ht 69.0 in | Wt 224.5 lb

## 2022-12-25 DIAGNOSIS — R051 Acute cough: Secondary | ICD-10-CM | POA: Diagnosis not present

## 2022-12-25 DIAGNOSIS — J101 Influenza due to other identified influenza virus with other respiratory manifestations: Secondary | ICD-10-CM | POA: Diagnosis not present

## 2022-12-25 LAB — POCT INFLUENZA A/B
Influenza A, POC: POSITIVE — AB
Influenza B, POC: NEGATIVE

## 2022-12-25 MED ORDER — AZELASTINE HCL 0.1 % NA SOLN: 1.0000 | Freq: Two times a day (BID) | NASAL | 1 refills | Status: AC

## 2022-12-25 MED ORDER — OSELTAMIVIR PHOSPHATE 75 MG PO CAPS: 75.0000 mg | ORAL_CAPSULE | Freq: Two times a day (BID) | ORAL | 0 refills | Status: AC

## 2022-12-25 NOTE — Progress Notes (Signed)
I,Jerry Walsh,acting as a Education administrator for [employer], PA-C.,have documented all relevant documentation on the behalf of Jerry Kirschner, PA-C,as directed by  Jerry Kirschner, PA-C while in the presence of Jerry Kirschner, PA-C.   Established patient visit   Patient: Jerry Walsh   DOB: 20-May-1961   62 y.o. Male  MRN: [id number] Visit Date: 12/25/2022  Today's healthcare provider: [name], PA-C   Cc. Cough, nasal congestion x 2 days  Subjective    HPI  Patient reports symptoms of congestion, runny nose, body aches, headaches, dry cough x 2 days ago. Reports taking a negative covid test at home. Managing with alka seltzer cold plus. Denies SOB, wheezing, fever.   Medications: Outpatient Medications Prior to Visit  Medication Sig   amLODipine (NORVASC) 5 MG tablet TAKE 1 TABLET(5 MG) BY MOUTH DAILY   hydrOXYzine (ATARAX/VISTARIL) 25 MG tablet Take 25 mg by mouth 2 (two) times daily.   tadalafil (CIALIS) 20 MG tablet TAKE 1 TABLET BY MOUTH AS  NEEDED NO MORE THAN 1  TABLET IN A DAY   tadalafil (CIALIS) 20 MG tablet Take 1 tablet (20 mg total) by mouth daily as needed for erectile dysfunction.   valsartan-hydrochlorothiazide (DIOVAN-HCT) 160-12.5 MG tablet TAKE 1 TABLET BY MOUTH DAILY   No facility-administered medications prior to visit.    Review of Systems  Constitutional:  Positive for fatigue. Negative for fever.  HENT:  Positive for congestion, rhinorrhea and sore throat.   Respiratory:  Positive for cough. Negative for shortness of breath.   Cardiovascular:  Negative for chest pain, palpitations and leg swelling.  Neurological:  Negative for dizziness and headaches.      Objective    Blood pressure (!) 162/86, pulse 80, height '5\' 9"'$  (1.753 m), weight 224 lb 8 oz (101.8 kg), SpO2 99 %.   Physical Exam Constitutional:      General: He is awake.     Appearance: He is well-developed.  HENT:     Head: Normocephalic.  Eyes:     Conjunctiva/sclera:  Conjunctivae normal.  Cardiovascular:     Rate and Rhythm: Normal rate and regular rhythm.     Heart sounds: Normal heart sounds.  Pulmonary:     Effort: Pulmonary effort is normal.     Breath sounds: Normal breath sounds. No wheezing, rhonchi or rales.  Skin:    General: Skin is warm.  Neurological:     Mental Status: He is alert and oriented to person, place, and time.  Psychiatric:        Attention and Perception: Attention normal.        Mood and Affect: Mood normal.        Speech: Speech normal.        Behavior: Behavior is cooperative.     Results for orders placed or performed in visit on 12/25/22  POCT Influenza A/B  Result Value Ref Range   Influenza A, POC Positive (A) Negative   Influenza B, POC Negative Negative    Assessment & Plan     Flu A Pos poc test for flu A Rx tamiflu as today is day 2 of symptoms Rx azelastine for rhinorrhea Increase fluids, stop alka seltzer as his BP is higher than normal today, can take mucinex, tylenol  Return if symptoms worsen or fail to improve.      I, Jerry Kirschner, PA-C have reviewed all documentation for this visit. The documentation on 12/25/22 for the exam, diagnosis, procedures, and orders are all  accurate and complete.  Jerry Kirschner, PA-C [facility] [address] [place], [place], [address] Office: [phone] Fax: [place]

## 2023-02-08 ENCOUNTER — Other Ambulatory Visit: Payer: Self-pay | Admitting: Family Medicine

## 2023-02-08 DIAGNOSIS — I10 Essential (primary) hypertension: Secondary | ICD-10-CM

## 2023-02-08 NOTE — Telephone Encounter (Signed)
Requested Prescriptions  Pending Prescriptions Disp Refills   amLODipine (NORVASC) 5 MG tablet [Pharmacy Med Name: AMLODIPINE BESYLATE 5MG  TABLETS] 90 tablet 0    Sig: TAKE 1 TABLET(5 MG) BY MOUTH DAILY     Cardiovascular: Calcium Channel Blockers 2 Failed - 02/08/2023  1:48 PM      Failed - Last BP in normal range    BP Readings from Last 1 Encounters:  12/25/22 (!) 162/86         Passed - Last Heart Rate in normal range    Pulse Readings from Last 1 Encounters:  12/25/22 80         Passed - Valid encounter within last 6 months    Recent Outpatient Visits           1 month ago Influenza [place] [name], PA-C   8 months ago Patellar tendinitis of right knee   [place], [place], PA-C   9 months ago Patellar tendinitis of right knee   [facility] Birdie Sons, MD   1 year ago Essential (primary) hypertension   [place], Donald E, MD   1 year ago Annual physical exam   [place], Donald E, MD       Future Appointments             In 7 months [place], Herbert Seta, MD [place]

## 2023-03-26 ENCOUNTER — Other Ambulatory Visit: Payer: Self-pay | Admitting: Family Medicine

## 2023-03-26 DIAGNOSIS — I10 Essential (primary) hypertension: Secondary | ICD-10-CM

## 2023-05-28 ENCOUNTER — Ambulatory Visit
Admission: EM | Admit: 2023-05-28 | Discharge: 2023-05-28 | Disposition: A | Discharge status: Home / Self Care | Payer: 59 | Attending: Physician Assistant | Admitting: Physician Assistant

## 2023-05-28 DIAGNOSIS — M62838 Other muscle spasm: Secondary | ICD-10-CM

## 2023-05-28 DIAGNOSIS — M545 Low back pain, unspecified: Secondary | ICD-10-CM

## 2023-05-28 MED ORDER — METHYLPREDNISOLONE 4 MG PO TBPK: ORAL_TABLET | ORAL | 0 refills | Status: DC

## 2023-05-28 MED ORDER — METHYLPREDNISOLONE ACETATE 80 MG/ML IJ SUSP
60.0000 mg | Freq: Once | INTRAMUSCULAR | Status: AC
  Administered 2023-05-28: 60 mg via INTRAMUSCULAR

## 2023-05-28 MED ORDER — METHOCARBAMOL 750 MG PO TABS: 750.0000 mg | ORAL_TABLET | Freq: Three times a day (TID) | ORAL | 0 refills | Status: AC | PRN

## 2023-05-28 MED ORDER — METHYLPREDNISOLONE ACETATE 40 MG/ML IJ SUSP: 60.0000 mg | Freq: Once | INTRAMUSCULAR | Status: DC

## 2023-05-28 NOTE — ED Provider Notes (Signed)
[facility]    CSN: [id number] Arrival date & time: 05/28/23  0800      History   Chief Complaint Chief Complaint  Patient presents with   Back Pain    HPI AUM TEER is a 62 y.o. male presenting for right lower back pain over the past couple days.  He reports over the weekend he went golfing and symptoms started not too long after that.  He reports it became much worse over night last night.  He reports he feels that if he moves a certain way is can this about muscle spasms.  He has felt a throbbing pain in his right lower back.  Pain does not radiate.  No numbness, weakness or tingling.  Pain is worse with prolonged sitting, changing positions.  Pain also worse with forward flexion.  He reports history of muscle spasms and cramps in his back before.  States he normally receives an injection, sometimes oral corticosteroids and also muscle relaxers and that typically helps.  No loss of bowel or bladder control.  HPI  Past Medical History:  Diagnosis Date   ED (erectile dysfunction) of organic origin 04/25/2006   Elevated PSA 08/10/2015   Essential (primary) hypertension 05/18/2008   Fam hx-ischem heart disease 05/18/2008   Hyperglycemia 08/10/2015   Insomnia due to medical condition 04/25/2006   OSA on CPAP 02/25/2007   Severe sleep apnea, AHI=96, desaturation to 71% on split night 03-06-2007. On CPAP   Scalp cyst 05/01/2017   Tobacco abuse 05/18/2015    Patient Active Problem List   Diagnosis Date Noted   Prediabetes 06/14/2021   Stopped smoking with greater than 20 pack year history 06/10/2020   Polyp of sigmoid colon    Scalp cyst 05/01/2017   Essential (primary) hypertension 05/18/2008   Fam hx-ischem heart disease 05/18/2008   OSA on CPAP 02/25/2007   Insomnia due to medical condition 04/25/2006   ED (erectile dysfunction) of organic origin 04/25/2006    Past Surgical History:  Procedure Laterality Date   COLONOSCOPY WITH PROPOFOL N/A 08/14/2019    Procedure: COLONOSCOPY WITH PROPOFOL;  Surgeon: Midge Minium, MD;  Location: [facility];  Service: Endoscopy;  Laterality: N/A;  sleep apnea   DENTAL SURGERY  06/2014   POLYPECTOMY  08/14/2019   Procedure: POLYPECTOMY;  Surgeon: Midge Minium, MD;  Location: [facility];  Service: Endoscopy;;   WISDOM TOOTH EXTRACTION  1989       Home Medications    Prior to Admission medications   Medication Sig Start Date End Date Taking? Authorizing Provider  methocarbamol (ROBAXIN) 750 MG tablet Take 1 tablet (750 mg total) by mouth every 8 (eight) hours as needed for up to 7 days for muscle spasms. 05/28/23 06/04/23 Yes Eusebio Friendly B, PA-C  amLODipine (NORVASC) 5 MG tablet TAKE 1 TABLET(5 MG) BY MOUTH DAILY 03/26/23   Malva Limes, MD  azelastine (ASTELIN) 0.1 % nasal spray Place 1 spray into both nostrils 2 (two) times daily. Use in each nostril as directed 12/25/22   Drubel, Lillia Abed, PA-C  hydrOXYzine (ATARAX/VISTARIL) 25 MG tablet Take 25 mg by mouth 2 (two) times daily. 09/10/21   [provider]  methylPREDNISolone (MEDROL DOSEPAK) 4 MG TBPK tablet Take po according to dosepack 05/28/23   Eusebio Friendly B, PA-C  tadalafil (CIALIS) 20 MG tablet TAKE 1 TABLET BY MOUTH AS  NEEDED NO MORE THAN 1  TABLET IN A DAY 09/18/21   Malva Limes, MD  tadalafil (CIALIS) 20  MG tablet Take 1 tablet (20 mg total) by mouth daily as needed for erectile dysfunction. 09/27/22   Sondra Come, MD  valsartan-hydrochlorothiazide (DIOVAN-HCT) 160-12.5 MG tablet TAKE 1 TABLET BY MOUTH DAILY 06/29/22   Malva Limes, MD    Family History Family History  Problem Relation Age of Onset   Diabetes Mother    Congestive Heart Failure Mother    Heart disease Father    Coronary artery disease Father    Diabetes Father     Social History Social History   Tobacco Use   Smoking status: Former    Packs/day: .75    Types: Cigarettes    Start date: 21    Quit date: 11/30/2019    Years since  quitting: 3.4   Smokeless tobacco: Never   Tobacco comments:    Started in his 53s. QUIT IN 2008, AND STARTED BACK  Vaping Use   Vaping Use: Never used  Substance Use Topics   Alcohol use: Yes    Alcohol/week: 5.0 standard drinks of alcohol    Types: 2 Cans of beer, 3 Standard drinks or equivalent per week    Comment: MODERATE USE   Drug use: No     Allergies   Bupropion   Review of Systems Review of Systems  Gastrointestinal:  Negative for abdominal pain.  Genitourinary:  Negative for difficulty urinating, dysuria, flank pain and hematuria.  Musculoskeletal:  Positive for back pain. Negative for arthralgias, gait problem and joint swelling.  Neurological:  Negative for weakness and numbness.     Physical Exam Triage Vital Signs ED Triage Vitals  Enc Vitals Group     BP 05/28/23 0817 (!) 153/93     Pulse Rate 05/28/23 0817 67     Resp --      Temp 05/28/23 0817 98.1 F (36.7 C)     Temp Source 05/28/23 0817 Oral     SpO2 05/28/23 0817 96 %     Weight --      Height --      Head Circumference --      Peak Flow --      Pain Score 05/28/23 0816 6     Pain Loc --      Pain Edu? --      Excl. in GC? --    No data found.  Updated Vital Signs BP (!) 153/93 (BP Location: Right Arm)   Pulse 67   Temp 98.1 F (36.7 C) (Oral)   SpO2 96%      Physical Exam Vitals and nursing note reviewed.  Constitutional:      General: He is not in acute distress.    Appearance: Normal appearance. He is well-developed. He is not ill-appearing.  HENT:     Head: Normocephalic and atraumatic.  Eyes:     General: No scleral icterus.    Conjunctiva/sclera: Conjunctivae normal.  Cardiovascular:     Rate and Rhythm: Normal rate and regular rhythm.     Heart sounds: Normal heart sounds.  Pulmonary:     Effort: Pulmonary effort is normal. No respiratory distress.     Breath sounds: Normal breath sounds.  Musculoskeletal:     Cervical back: Neck supple.     Lumbar back: Spasms  present. No tenderness or bony tenderness. Decreased range of motion. Negative right straight leg raise test and negative left straight leg raise test.  Skin:    General: Skin is warm and dry.     Capillary Refill: Capillary  refill takes less than 2 seconds.  Neurological:     General: No focal deficit present.     Mental Status: He is alert. Mental status is at baseline.     Motor: No weakness.     Gait: Gait normal.  Psychiatric:        Mood and Affect: Mood normal.        Behavior: Behavior normal.      UC Treatments / Results  Labs (all labs ordered are listed, but only abnormal results are displayed) Labs Reviewed - No data to display  EKG   Radiology No results found.  Procedures Procedures (including critical care time)  Medications Ordered in UC Medications  methylPREDNISolone acetate (DEPO-MEDROL) injection 60 mg (has no administration in time range)    Initial Impression / Assessment and Plan / UC Course  I have reviewed the triage vital signs and the nursing notes.  Pertinent labs & imaging results that were available during my care of the patient were reviewed by me and considered in my medical decision making (see chart for details).   62 year old male presents for right lower back pain and spasms for the past couple days.  Symptoms worse overnight.  Symptoms began after golfing.  He reports history of similar symptoms with back spasms.  No red flag signs or symptoms.  His vitals are stable.  His blood pressure is a little high at 153/93 but he reports he has not taken his blood pressure medication or any other medication today.  Reports that he just came straight to the urgent care.  He does not have any tenderness to palpation of his back but he does have stiffness of the right lumbar region.  Negative straight leg raise.  Reduced range of motion especially with forward flexion and extension of back.  Suspect muscle strain and spasm.  Patient given 60 mg IM  Depo-Medrol.  Start Medrol Dosepak tomorrow if still feeling symptoms without improvement.  Sent Robaxin to pharmacy as well.  Reviewed RICE guidelines.  Reviewed return and ER precautions.  Work note provided.   Final Clinical Impressions(s) / UC Diagnoses   Final diagnoses:  Acute right-sided low back pain without sciatica  Muscle spasm     Discharge Instructions      BACK PAIN: Stressed avoiding painful activities . RICE (REST, ICE, COMPRESSION, ELEVATION) guidelines reviewed. May alternate ice and heat. Consider use of muscle rubs, Salonpas patches, etc. Use medications as directed including muscle relaxers if prescribed. Take anti-inflammatory medications as prescribed or OTC NSAIDs/Tylenol.  F/u with PCP in 7-10 days for reexamination, and please feel free to call or return to the urgent care at any time for any questions or concerns you may have and we will be happy to help you!   BACK PAIN RED FLAGS: If the back pain acutely worsens or there are any red flag symptoms such as numbness/tingling, leg weakness, saddle anesthesia, or loss of bowel/bladder control, go immediately to the ER. Follow up with [place] as scheduled or sooner if the pain does not begin to resolve or if it worsens before the follow up       ED Prescriptions     Medication Sig Dispense Auth. Provider   methylPREDNISolone (MEDROL DOSEPAK) 4 MG TBPK tablet  (Status: Discontinued) Take po according to dosepack 21 tablet Eusebio Friendly B, PA-C   methocarbamol (ROBAXIN) 750 MG tablet Take 1 tablet (750 mg total) by mouth every 8 (eight) hours as needed for up to 7  days for muscle spasms. 20 tablet Eusebio Friendly B, PA-C   methylPREDNISolone (MEDROL DOSEPAK) 4 MG TBPK tablet Take po according to dosepack 21 tablet Shirlee Latch, PA-C      I have reviewed the PDMP during this encounter.   Shirlee Latch, PA-C 05/28/23 [phone]

## 2023-05-28 NOTE — Discharge Instructions (Addendum)

## 2023-05-28 NOTE — ED Triage Notes (Signed)
Pt presents to UC c/o back pain onset some last night, pt states he felt it coming on. Pt reports he has had some spasms in the last, RT lower back

## 2023-08-13 ENCOUNTER — Other Ambulatory Visit: Payer: Self-pay | Admitting: Family Medicine

## 2023-08-13 DIAGNOSIS — I10 Essential (primary) hypertension: Secondary | ICD-10-CM

## 2023-09-26 ENCOUNTER — Ambulatory Visit (INDEPENDENT_AMBULATORY_CARE_PROVIDER_SITE_OTHER): Payer: 59 | Admitting: Urology

## 2023-09-26 VITALS — BP 148/101 | HR 75 | Ht 69.0 in | Wt 218.0 lb

## 2023-09-26 DIAGNOSIS — N529 Male erectile dysfunction, unspecified: Secondary | ICD-10-CM

## 2023-09-26 DIAGNOSIS — Z125 Encounter for screening for malignant neoplasm of prostate: Secondary | ICD-10-CM

## 2023-09-26 MED ORDER — TADALAFIL 20 MG PO TABS: 20.0000 mg | ORAL_TABLET | Freq: Every day | ORAL | 3 refills | Status: DC | PRN

## 2023-09-26 NOTE — Patient Instructions (Signed)

## 2023-09-26 NOTE — Progress Notes (Signed)
   09/26/2023 3:46 PM   Jerry Walsh Alipio 09-30-1961 [id number]  Reason for visit: Follow up elevated PSA, ED  HPI: 62 year old male with long history of mildly elevated PSA, with increase to 6.8 with 12% free that prompted a biopsy in March 2022.  This showed a 38 g prostate with a PSA density of 0.17, and there was a large median lobe, all biopsies showed BPH and inflammation with no evidence of malignancy.  He also had a prostate MRI in February 2020 that showed a 56 g prostate with no suspicious lesions.    Repeat PSAs have been essentially stable since that time, ranging from 5.5-6.8.  PSA today is still pending.  We discussed the reassuring findings of a negative prostate biopsy and benign prostate MRI, as well as the stability of the PSA over the last few years.  We discussed the low, but non-zero risk, of missing a clinically significant prostate cancer by deferring further evaluation.  He opts to continue yearly PSA screening which I think is very reasonable.  Consider 4K score in the future if persistently rising PSA.  He is also using Cialis 20 mg on demand for ED with good results, he would like to continue that medication   Cialis 20 mg as needed for ED refilled PSA today, with results, if stable RTC 1 year PSA prior   Sondra Come, MD  [facility] [address], [address] [place], [place] [address] [phone]

## 2023-09-27 DIAGNOSIS — Z125 Encounter for screening for malignant neoplasm of prostate: Secondary | ICD-10-CM

## 2023-09-27 LAB — PSA TOTAL (REFLEX TO FREE): Prostate Specific Ag, Serum: 7.5 ng/mL — ABNORMAL HIGH (ref 0.0–4.0)

## 2023-09-27 LAB — FPSA% REFLEX
% FREE PSA: 11.1 %
PSA, FREE: 0.83 ng/mL

## 2023-09-27 NOTE — Telephone Encounter (Signed)
PSA ordered. Lab appt scheduled.

## 2023-10-04 ENCOUNTER — Other Ambulatory Visit: Payer: Self-pay | Admitting: Family Medicine

## 2023-10-04 DIAGNOSIS — I10 Essential (primary) hypertension: Secondary | ICD-10-CM

## 2023-10-04 MED ORDER — VALSARTAN-HYDROCHLOROTHIAZIDE 160-12.5 MG PO TABS: 1.0000 | ORAL_TABLET | Freq: Every day | ORAL | 1 refills | Status: DC

## 2023-10-04 NOTE — Telephone Encounter (Signed)
Medication Refill -  Most Recent Primary Care Visit:  Provider: Alfredia Ferguson  Department: [facility]  Visit Type: OFFICE VISIT  Date: 12/25/2022  Medication: amLODipine (NORVASC) 5 MG tablet   Has the patient contacted their pharmacy? Yes (Agent: If no, request that the patient contact the pharmacy for the refill. If patient does not wish to contact the pharmacy document the reason why and proceed with request.) (Agent: If yes, when and what did the pharmacy advise?)  Is this the correct pharmacy for this prescription? Yes If no, delete pharmacy and type the correct one.  This is the patient's preferred pharmacy:   [id number] - [place], [place] - [address] AT [facility] OF [address] [place] [address] [place] [address] Phone: [phone] Fax: [phone]  Has the prescription been filled recently? Yes  Is the patient out of the medication? No has two left   Has the patient been seen for an appointment in the last year OR does the patient have an upcoming appointment? Yes  Can we respond through MyChart? Yes.  Agent: Please be advised that Rx refills may take up to 3 business days. We ask that you follow-up with your pharmacy.

## 2023-10-07 NOTE — Telephone Encounter (Signed)
Need to call pharmacy - pt should have 2 refills available

## 2023-10-07 NOTE — Telephone Encounter (Signed)
Request is too soon for refill. Last refill 03/26/23 for 90 and 3 refills. Patient needs to contact pharmacy.  Requested Prescriptions  Pending Prescriptions Disp Refills   amLODipine (NORVASC) 5 MG tablet 90 tablet 3     Cardiovascular: Calcium Channel Blockers 2 Failed - 10/04/2023 12:00 PM      Failed - Last BP in normal range    BP Readings from Last 1 Encounters:  09/26/23 (!) 148/101         Failed - Valid encounter within last 6 months    Recent Outpatient Visits           9 months ago Influenza A   [place] [facility] Alfredia Ferguson, PA-C   1 year ago Patellar tendinitis of right knee   [place] [facility] [place] [place], PA-C   1 year ago Patellar tendinitis of right knee   [facility] Malva Limes, MD   1 year ago Essential (primary) hypertension   [place] [facility] Malva Limes, MD   2 years ago Annual physical exam   [facility] Malva Limes, MD       Future Appointments             In 1 month Fisher, Demetrios Isaacs, MD [facility]            Passed - Last Heart Rate in normal range    Pulse Readings from Last 1 Encounters:  09/26/23 75

## 2023-10-10 ENCOUNTER — Telehealth: Payer: Self-pay | Admitting: Family Medicine

## 2023-10-10 NOTE — Telephone Encounter (Signed)
Patient called, left VM to return the call to the office. A refill was sent to the pharmacy on 10/04/23 #30/1 refill. Will need to know if he picked that up and why he is requesting a 90 day supply. It was noted that he will need a follow up appointment prior to additional refills and he has to be seen at his upcoming visit in December to receive more refills.

## 2023-10-10 NOTE — Telephone Encounter (Signed)
Medication Refill -  Most Recent Primary Care Visit:  Provider: Alfredia Ferguson  Department: [facility]  Visit Type: OFFICE VISIT  Date: 12/25/2022  ( Medication: valsartan-hydrochlorothiazide (DIOVAN-HCT) 160-12.5 MG tablet [[id number]]  (requesting 90 day supply)   [id number] - [place], [place] - [address] AT [facility] OF [address] [place]  [address] [place] [address]  Phone: [phone] Fax: [phone]  Hours: Not open 24 hours    Has the patient contacted their pharmacy? Yes (Agent: If no, request that the patient contact the pharmacy for the refill. If patient does not wish to contact the pharmacy document the reason why and proceed with request.) (Agent: If yes, when and what did the pharmacy advise?)  Is this the correct pharmacy for this prescription? Yes If no, delete pharmacy and type the correct one.  This is the patient's preferred pharmacy:  [facility] #[id number] - [place], [place] - [address] AT [facility] OF [address] [place] [address] [place] [address] Phone: [phone] Fax: [phone]    Has the prescription been filled recently? No  Is the patient out of the medication? Yes  Has the patient been seen for an appointment in the last year OR does the patient have an upcoming appointment? Yes  Can we respond through MyChart?yes  Agent: Please be advised that Rx refills may take up to 3 business days. We ask that you follow-up with your pharmacy.

## 2023-10-11 NOTE — Telephone Encounter (Signed)
Called [facility] pharmacy to verify if patient picked up valsartan-hydrochlorothiazide (DIOVAN-HCT) 160-12.5 MG that was refilled 10/04/23 for 30 and 1 refill. Long wait time over 7 minutes, did not speak with staff.  Called patient to verify if he received 30 day supply that was refilled 10/04/23. Patient states he got the 30 supply, but had to pay out of pocket of 111.00 dollars. Patient states he requested 90 days so insurance could pay for medication. Patient states for his next refill it needs to be for 90 days and will discuss concern at future OV 11/15/23.

## 2023-10-25 ENCOUNTER — Other Ambulatory Visit: Payer: Self-pay | Admitting: Family Medicine

## 2023-10-25 DIAGNOSIS — I10 Essential (primary) hypertension: Secondary | ICD-10-CM

## 2023-10-25 MED ORDER — VALSARTAN-HYDROCHLOROTHIAZIDE 160-12.5 MG PO TABS: 1.0000 | ORAL_TABLET | Freq: Every day | ORAL | 0 refills | Status: DC

## 2023-11-15 ENCOUNTER — Encounter: Payer: Self-pay | Admitting: Family Medicine

## 2023-11-15 ENCOUNTER — Ambulatory Visit (INDEPENDENT_AMBULATORY_CARE_PROVIDER_SITE_OTHER): Payer: 59 | Admitting: Family Medicine

## 2023-11-15 VITALS — BP 153/88 | HR 66 | Resp 18 | Ht 69.0 in | Wt 226.0 lb

## 2023-11-15 DIAGNOSIS — N3289 Other specified disorders of bladder: Secondary | ICD-10-CM

## 2023-11-15 DIAGNOSIS — Z23 Encounter for immunization: Secondary | ICD-10-CM | POA: Diagnosis not present

## 2023-11-15 DIAGNOSIS — Z87891 Personal history of nicotine dependence: Secondary | ICD-10-CM | POA: Diagnosis not present

## 2023-11-15 DIAGNOSIS — I10 Essential (primary) hypertension: Secondary | ICD-10-CM | POA: Diagnosis not present

## 2023-11-15 DIAGNOSIS — R7303 Prediabetes: Secondary | ICD-10-CM

## 2023-11-15 DIAGNOSIS — M5136 Other intervertebral disc degeneration, lumbar region with discogenic back pain only: Secondary | ICD-10-CM

## 2023-11-15 DIAGNOSIS — Z0000 Encounter for general adult medical examination without abnormal findings: Secondary | ICD-10-CM | POA: Diagnosis not present

## 2023-11-15 DIAGNOSIS — G4733 Obstructive sleep apnea (adult) (pediatric): Secondary | ICD-10-CM

## 2023-11-15 DIAGNOSIS — N401 Enlarged prostate with lower urinary tract symptoms: Secondary | ICD-10-CM

## 2023-11-15 DIAGNOSIS — Z860101 Personal history of adenomatous and serrated colon polyps: Secondary | ICD-10-CM

## 2023-11-15 NOTE — Patient Instructions (Signed)
Please review the attached list of medications and notify my office if there are any errors.   We'll adjust the dose of the valsartan-hydrochlorothiazide to 160-25 with your next refill due in February.

## 2023-11-15 NOTE — Progress Notes (Unsigned)
Complete physical exam   Patient: Jerry Walsh   DOB: 1961-03-20   62 y.o. Male  MRN: [id number] Visit Date: 11/15/2023  Today's healthcare provider: Mila Merry, MD   Chief Complaint  Patient presents with  . Annual Exam  . Hypertension   Subjective    Jerry Walsh is a 62 y.o. male who presents today for a complete physical exam.  He reports consuming a  healthy  diet.  Exercises regularly but has been limited by episodes of back spasms. Been going to [facility] and had MRI showing extensive lumbar disk disease. Also had thickened gallbladder wall. Has BPH followed by [facility].     Last eye exam recently. Has annually.   Doing well amlodipine, valsartan-hydrochlorothiazide for BP. Trying to eat healthy diet.  Quit smoking 2021. Doing well.  Past Medical History:  Diagnosis Date  . ED (erectile dysfunction) of organic origin 04/25/2006  . Elevated PSA 08/10/2015  . Essential (primary) hypertension 05/18/2008  . Fam hx-ischem heart disease 05/18/2008  . Hyperglycemia 08/10/2015  . Insomnia due to medical condition 04/25/2006  . OSA on CPAP 02/25/2007   Severe sleep apnea, AHI=96, desaturation to 71% on split night 03-06-2007. On CPAP  . Scalp cyst 05/01/2017  . Sleep apnea   . Tobacco abuse 05/18/2015   Past Surgical History:  Procedure Laterality Date  . COLONOSCOPY WITH PROPOFOL N/A 08/14/2019   Procedure: COLONOSCOPY WITH PROPOFOL;  Surgeon: Midge Minium, MD;  Location: [facility];  Service: Endoscopy;  Laterality: N/A;  sleep apnea  . DENTAL SURGERY  06/2014  . POLYPECTOMY  08/14/2019   Procedure: POLYPECTOMY;  Surgeon: Midge Minium, MD;  Location: [facility];  Service: Endoscopy;;  . WISDOM TOOTH EXTRACTION  1989   Social History   Socioeconomic History  . Marital status: Married    Spouse name: Not on file  . Number of children: Not on file  . Years of education: Not on file  . Highest education level: Bachelor's degree  (e.g., BA, AB, BS)  Occupational History  . Not on file  Tobacco Use  . Smoking status: Former    Current packs/day: 0.00    Average packs/day: 0.8 packs/day for 36.0 years (27.0 ttl pk-yrs)    Types: Cigarettes    Start date: 4    Quit date: 11/30/2019    Years since quitting: 3.9  . Smokeless tobacco: Never  . Tobacco comments:    I quit in January of 2021  Vaping Use  . Vaping status: Never Used  Substance and Sexual Activity  . Alcohol use: Yes    Alcohol/week: 10.0 standard drinks of alcohol    Types: 4 Cans of beer, 4 Shots of liquor, 2 Standard drinks or equivalent per week    Comment: MODERATE USE  . Drug use: No  . Sexual activity: Yes    Birth control/protection: None  Other Topics Concern  . Not on file  Social History Narrative  . Not on file   Social Drivers of Health   Financial Resource Strain: Patient Declined (11/14/2023)   Overall Financial Resource Strain (CARDIA)   . Difficulty of Paying Living Expenses: Patient declined  Food Insecurity: No Food Insecurity (11/14/2023)   Hunger Vital Sign   . Worried About Programme researcher, broadcasting/film/video in the Last Year: Never true   . Ran Out of Food in the Last Year: Never true  Transportation Needs: No Transportation Needs (11/14/2023)   PRAPARE - Transportation   .  Lack of Transportation (Medical): No   . Lack of Transportation (Non-Medical): No  Physical Activity: Unknown (11/14/2023)   Exercise Vital Sign   . Days of Exercise per Week: Patient declined   . Minutes of Exercise per Session: Not on file  Stress: Stress Concern Present (11/14/2023)   [employer] of Occupational Health - Occupational Stress Questionnaire   . Feeling of Stress : To some extent  Social Connections: Moderately Integrated (11/14/2023)   Social Connection and Isolation Panel [NHANES]   . Frequency of Communication with Friends and Family: More than three times a week   . Frequency of Social Gatherings with Friends and Family: Once a  week   . Attends Religious Services: More than 4 times per year   . Active Member of Clubs or Organizations: No   . Attends Banker Meetings: Not on file   . Marital Status: Married  Catering manager Violence: Not on file   Family Status  Relation Name Status  . Mother Flora Deceased  . Father Alger Deceased  . Brother 1 Alive  . Brother 2 Deceased  No partnership data on file   Family History  Problem Relation Age of Onset  . Diabetes Mother   . Congestive Heart Failure Mother   . Heart disease Father   . Coronary artery disease Father   . Diabetes Father   . Stroke Father    Allergies  Allergen Reactions  . Bupropion Itching    Patient Care Team: Malva Limes, MD as PCP - General (Family Medicine) Sondra Come, MD as Consulting Physician (Urology)   Medications: Outpatient Medications Prior to Visit  Medication Sig  . amLODipine (NORVASC) 5 MG tablet TAKE 1 TABLET(5 MG) BY MOUTH DAILY  . azelastine (ASTELIN) 0.1 % nasal spray Place 1 spray into both nostrils 2 (two) times daily. Use in each nostril as directed  . HYDROcodone-acetaminophen (NORCO/VICODIN) 5-325 MG tablet Take 1 tablet by mouth every 8 (eight) hours.  . hydrOXYzine (ATARAX/VISTARIL) 25 MG tablet Take 25 mg by mouth 2 (two) times daily.  . tadalafil (CIALIS) 20 MG tablet TAKE 1 TABLET BY MOUTH AS  NEEDED NO MORE THAN 1  TABLET IN A DAY  . tadalafil (CIALIS) 20 MG tablet Take 1 tablet (20 mg total) by mouth daily as needed for erectile dysfunction.  . valsartan-hydrochlorothiazide (DIOVAN-HCT) 160-12.5 MG tablet Take 1 tablet by mouth daily.   No facility-administered medications prior to visit.    Review of Systems  Constitutional:  Negative for chills, diaphoresis and fever.  HENT:  Negative for congestion, ear discharge, ear pain, hearing loss, nosebleeds, sore throat and tinnitus.   Eyes:  Negative for photophobia, pain, discharge and redness.  Respiratory:  Negative for cough,  shortness of breath, wheezing and stridor.   Cardiovascular:  Negative for chest pain, palpitations and leg swelling.  Gastrointestinal:  Negative for abdominal pain, blood in stool, constipation, diarrhea, nausea and vomiting.  Endocrine: Negative for polydipsia.  Genitourinary:  Negative for dysuria, flank pain, frequency, hematuria and urgency.  Musculoskeletal:  Negative for back pain, myalgias and neck pain.  Skin:  Negative for rash.  Allergic/Immunologic: Negative for environmental allergies.  Neurological:  Negative for dizziness, tremors, seizures, weakness and headaches.  Hematological:  Does not bruise/bleed easily.  Psychiatric/Behavioral:  Negative for hallucinations and suicidal ideas. The patient is not nervous/anxious.    {Insert previous labs (optional):23779} {See past labs  Heme  Chem  Endocrine  Serology  Results Review (optional):1}  Objective    BP (!) 153/88   Pulse 66   Resp 18   Ht 5\' 9"  (1.753 m)   Wt 226 lb (102.5 kg)   SpO2 99%   BMI 33.37 kg/m  {Insert last BP/Wt (optional):23777}{See vitals history (optional):1}  Physical Exam   General Appearance:    Mildly obese male. Alert, cooperative, in no acute distress, appears stated age  Head:    Normocephalic, without obvious abnormality, atraumatic  Eyes:    PERRL, conjunctiva/corneas clear, EOM's intact, fundi    benign, both eyes       Ears:    Normal TM's and external ear canals, both ears  Nose:   Nares normal, septum midline, mucosa normal, no drainage   or sinus tenderness  Throat:   Lips, mucosa, and tongue normal; teeth and gums normal  Neck:   Supple, symmetrical, trachea midline, no adenopathy;       thyroid:  No enlargement/tenderness/nodules; no carotid   bruit or JVD  Back:     Symmetric, no curvature, ROM normal, no CVA tenderness  Lungs:     Clear to auscultation bilaterally, respirations unlabored  Chest wall:    No tenderness or deformity  Heart:    Normal heart rate. Normal  rhythm. No murmurs, rubs, or gallops.  S1 and S2 normal  Abdomen:     Soft, non-tender, bowel sounds active all four quadrants,    no masses, no organomegaly  Genitalia:    deferred  Rectal:    deferred  Extremities:   All extremities are intact. No cyanosis or edema  Pulses:   2+ and symmetric all extremities  Skin:   Skin color, texture, turgor normal, no rashes or lesions  Lymph nodes:   Cervical, supraclavicular, and axillary nodes normal  Neurologic:   CNII-XII intact. Normal strength, sensation and reflexes      throughout     Last depression screening scores    11/15/2023    1:21 PM 12/25/2022    2:44 PM 06/14/2021   10:15 AM  PHQ 2/9 Scores  PHQ - 2 Score 0 0 0  PHQ- 9 Score 0 0    Last fall risk screening    11/15/2023    1:21 PM  Fall Risk   Falls in the past year? 0  Number falls in past yr: 0  Injury with Fall? 0   Last Audit-C alcohol use screening    11/14/2023    7:20 PM  Alcohol Use Disorder Test (AUDIT)  1. How often do you have a drink containing alcohol? 2  2. How many drinks containing alcohol do you have on a typical day when you are drinking? 2  3. How often do you have six or more drinks on one occasion? 1  AUDIT-C Score 5   4. How often during the last year have you found that you were not able to stop drinking once you had started? 0  5. How often during the last year have you failed to do what was normally expected from you because of drinking? 0  6. How often during the last year have you needed a first drink in the morning to get yourself going after a heavy drinking session? 0  7. How often during the last year have you had a feeling of guilt of remorse after drinking? 0  8. How often during the last year have you been unable to remember what happened the night before because you had been drinking? 0  9. Have you or someone else been injured as a result of your drinking? 0  10. Has a relative or friend or a doctor or another health worker  been concerned about your drinking or suggested you cut down? 0  Alcohol Use Disorder Identification Test Final Score (AUDIT) 5      Patient-reported   A score of 3 or more in women, and 4 or more in men indicates increased risk for alcohol abuse, EXCEPT if all of the points are from question 1   No results found for any visits on 11/15/23.  Assessment & Plan    Routine Health Maintenance and Physical Exam  Exercise Activities and Dietary recommendations  Goals   None     Immunization History  Administered Date(s) Administered  . Influenza,inj,Quad PF,6+ Mos 09/09/2020  . Influenza-Unspecified 09/07/2021  . Moderna Covid-19 Vaccine Bivalent Booster 60yrs & up 10/12/2021  . PFIZER(Purple Top)SARS-COV-2 Vaccination 02/04/2020, 03/03/2020, 10/05/2020  . Tdap 05/18/2008, 07/04/2010  . Zoster Recombinant(Shingrix) 06/10/2020, 08/26/2020    Health Maintenance  Topic Date Due  . Lung Cancer Screening  Never done  . DTaP/Tdap/Td (3 - Td or Tdap) 07/04/2020  . INFLUENZA VACCINE  06/27/2023  . COVID-19 Vaccine (5 - 2024-25 season) 07/28/2023  . Colonoscopy  08/13/2024  . Hepatitis C Screening  Completed  . HIV Screening  Completed  . Zoster Vaccines- Shingrix  Completed  . HPV VACCINES  Aged Out    Discussed health benefits of physical activity, and encouraged him to engage in regular exercise appropriate for his age and condition.  ***  No follow-ups on file.     {provider attestation***:1}   Mila Merry, MD  [facility] [phone] (phone) [phone] (fax)  [facility]

## 2023-11-16 LAB — COMPREHENSIVE METABOLIC PANEL
ALT: 36 [IU]/L (ref 0–44)
AST: 28 [IU]/L (ref 0–40)
Albumin: 4.6 g/dL (ref 3.9–4.9)
Alkaline Phosphatase: 82 [IU]/L (ref 44–121)
BUN/Creatinine Ratio: 11 (ref 10–24)
BUN: 15 mg/dL (ref 8–27)
Bilirubin Total: 0.6 mg/dL (ref 0.0–1.2)
CO2: 29 mmol/L (ref 20–29)
Calcium: 10 mg/dL (ref 8.6–10.2)
Chloride: 98 mmol/L (ref 96–106)
Creatinine, Ser: 1.34 mg/dL — ABNORMAL HIGH (ref 0.76–1.27)
Globulin, Total: 2.5 g/dL (ref 1.5–4.5)
Glucose: 155 mg/dL — ABNORMAL HIGH (ref 70–99)
Potassium: 3.8 mmol/L (ref 3.5–5.2)
Sodium: 141 mmol/L (ref 134–144)
Total Protein: 7.1 g/dL (ref 6.0–8.5)
eGFR: 60 mL/min/{1.73_m2} (ref >59)

## 2023-11-16 LAB — CBC
Hematocrit: 41.5 % (ref 37.5–51.0)
Hemoglobin: 13.5 g/dL (ref 13.0–17.7)
MCH: 29.4 pg (ref 26.6–33.0)
MCHC: 32.5 g/dL (ref 31.5–35.7)
MCV: 90 fL (ref 79–97)
Platelets: 420 10*3/uL (ref 150–450)
RBC: 4.59 x10E6/uL (ref 4.14–5.80)
RDW: 12.3 % (ref 11.6–15.4)
WBC: 8.8 10*3/uL (ref 3.4–10.8)

## 2023-11-16 LAB — LIPID PANEL
Chol/HDL Ratio: 3.8 {ratio} (ref 0.0–5.0)
Cholesterol, Total: 223 mg/dL — ABNORMAL HIGH (ref 100–199)
HDL: 58 mg/dL (ref >39)
LDL Chol Calc (NIH): 145 mg/dL — ABNORMAL HIGH (ref 0–99)
Triglycerides: 110 mg/dL (ref 0–149)
VLDL Cholesterol Cal: 20 mg/dL (ref 5–40)

## 2023-11-16 LAB — HEMOGLOBIN A1C
Est. average glucose Bld gHb Est-mCnc: 154 mg/dL
Hgb A1c MFr Bld: 7 % — ABNORMAL HIGH (ref 4.8–5.6)

## 2023-11-16 LAB — TSH: TSH: 0.845 u[IU]/mL (ref 0.450–4.500)

## 2023-11-18 ENCOUNTER — Other Ambulatory Visit: Payer: Self-pay

## 2024-02-26 ENCOUNTER — Ambulatory Visit (INDEPENDENT_AMBULATORY_CARE_PROVIDER_SITE_OTHER): Payer: Self-pay | Admitting: Family Medicine

## 2024-02-26 ENCOUNTER — Encounter: Payer: Self-pay | Admitting: Family Medicine

## 2024-02-26 VITALS — BP 135/84 | HR 62 | Temp 98.1°F | Wt 215.0 lb

## 2024-02-26 DIAGNOSIS — R42 Dizziness and giddiness: Secondary | ICD-10-CM | POA: Diagnosis not present

## 2024-02-26 DIAGNOSIS — G459 Transient cerebral ischemic attack, unspecified: Secondary | ICD-10-CM

## 2024-02-26 DIAGNOSIS — R202 Paresthesia of skin: Secondary | ICD-10-CM

## 2024-02-26 DIAGNOSIS — R7303 Prediabetes: Secondary | ICD-10-CM | POA: Diagnosis not present

## 2024-02-26 DIAGNOSIS — R200 Anesthesia of skin: Secondary | ICD-10-CM

## 2024-02-26 DIAGNOSIS — R29898 Other symptoms and signs involving the musculoskeletal system: Secondary | ICD-10-CM

## 2024-02-26 MED ORDER — ASPIRIN 81 MG PO TBEC
81.0000 mg | DELAYED_RELEASE_TABLET | Freq: Every day | ORAL | Status: AC
Start: 2024-02-26 — End: ?

## 2024-02-26 NOTE — Patient Instructions (Signed)
 Please review the attached list of medications and notify my office if there are any errors.   Start taking 81mg  coated aspirin tablet once a day

## 2024-02-27 LAB — COMPREHENSIVE METABOLIC PANEL WITH GFR
ALT: 29 IU/L (ref 0–44)
AST: 27 IU/L (ref 0–40)
Albumin: 4.3 g/dL (ref 3.9–4.9)
Alkaline Phosphatase: 65 IU/L (ref 44–121)
BUN/Creatinine Ratio: 14 (ref 10–24)
BUN: 19 mg/dL (ref 8–27)
Bilirubin Total: 0.4 mg/dL (ref 0.0–1.2)
CO2: 24 mmol/L (ref 20–29)
Calcium: 9.6 mg/dL (ref 8.6–10.2)
Chloride: 104 mmol/L (ref 96–106)
Creatinine, Ser: 1.4 mg/dL — ABNORMAL HIGH (ref 0.76–1.27)
Globulin, Total: 2.5 g/dL (ref 1.5–4.5)
Glucose: 113 mg/dL — ABNORMAL HIGH (ref 70–99)
Potassium: 4.4 mmol/L (ref 3.5–5.2)
Sodium: 142 mmol/L (ref 134–144)
Total Protein: 6.8 g/dL (ref 6.0–8.5)
eGFR: 56 mL/min/{1.73_m2} — ABNORMAL LOW (ref >59)

## 2024-02-27 LAB — CBC
Hematocrit: 41.4 % (ref 37.5–51.0)
Hemoglobin: 13.9 g/dL (ref 13.0–17.7)
MCH: 30.4 pg (ref 26.6–33.0)
MCHC: 33.6 g/dL (ref 31.5–35.7)
MCV: 91 fL (ref 79–97)
Platelets: 393 10*3/uL (ref 150–450)
RBC: 4.57 x10E6/uL (ref 4.14–5.80)
RDW: 12.5 % (ref 11.6–15.4)
WBC: 7.2 10*3/uL (ref 3.4–10.8)

## 2024-02-27 LAB — HEMOGLOBIN A1C
Est. average glucose Bld gHb Est-mCnc: 114 mg/dL
Hgb A1c MFr Bld: 5.6 % (ref 4.8–5.6)

## 2024-02-27 LAB — VITAMIN B12: Vitamin B-12: 755 pg/mL (ref 232–1245)

## 2024-02-27 LAB — VITAMIN D 25 HYDROXY (VIT D DEFICIENCY, FRACTURES): Vit D, 25-Hydroxy: 28.4 ng/mL — ABNORMAL LOW (ref 30.0–100.0)

## 2024-02-28 ENCOUNTER — Encounter: Payer: Self-pay | Admitting: Family Medicine

## 2024-03-10 ENCOUNTER — Telehealth: Payer: Self-pay | Admitting: Family Medicine

## 2024-03-10 ENCOUNTER — Ambulatory Visit
Admission: RE | Admit: 2024-03-10 | Discharge: 2024-03-10 | Disposition: A | Discharge status: Home / Self Care | Source: Ambulatory Visit | Attending: Family Medicine | Admitting: Family Medicine

## 2024-03-10 DIAGNOSIS — I10 Essential (primary) hypertension: Secondary | ICD-10-CM

## 2024-03-10 DIAGNOSIS — R42 Dizziness and giddiness: Secondary | ICD-10-CM

## 2024-03-10 DIAGNOSIS — G459 Transient cerebral ischemic attack, unspecified: Secondary | ICD-10-CM

## 2024-03-10 DIAGNOSIS — R29898 Other symptoms and signs involving the musculoskeletal system: Secondary | ICD-10-CM

## 2024-03-10 MED ORDER — AMLODIPINE BESYLATE 5 MG PO TABS
5.0000 mg | ORAL_TABLET | Freq: Every day | ORAL | 3 refills | Status: AC
Start: 2024-03-10 — End: ?

## 2024-03-10 MED ORDER — VALSARTAN-HYDROCHLOROTHIAZIDE 160-12.5 MG PO TABS: 1.0000 | ORAL_TABLET | Freq: Every day | ORAL | 2 refills | Status: DC

## 2024-03-10 NOTE — Addendum Note (Signed)
 Addended by: Lamon Pillow on: 03/10/2024 12:58 PM   Modules accepted: Orders

## 2024-03-10 NOTE — Telephone Encounter (Signed)
 Patient left medication in a hotel in [place] ,please refill. [facility] in [place]   amLODipine (NORVASC) 5 MG tablet   valsartan-hydrochlorothiazide (DIOVAN-HCT) 160-12.5 MG tablet

## 2024-03-13 ENCOUNTER — Encounter: Payer: Self-pay | Admitting: Family Medicine

## 2024-03-13 NOTE — Progress Notes (Signed)
 Established patient visit   Patient: Jerry Walsh   DOB: [birth year]   63 y.o. Male  MRN: [id number] Visit Date: 02/26/2024  Today's healthcare provider: Jeralene Mom, MD   Chief Complaint  Patient presents with   Leg Heaviness    Patient states yesterday he woke up with some dizziness/vertigo.  He tried to get out of bed and was having difficulty.  He states his leg was not numb but felt heavy.  He said all day he felt that he had to be focused on moving the leg with walking or driving.  The vertigo was better by mid afternoon and the leg felt better by about 5pm.  Today he said it feels fine.     Subjective    Discussed the use of AI scribe software for clinical note transcription with the patient, who gave verbal consent to proceed.  History of Present Illness   Jerry Walsh is a 63 year old male who presents with dizziness and right leg heaviness.  He experienced dizziness upon waking up yesterday, described as an 'off balance' feeling rather than a spinning sensation. This dizziness persisted into the late morning but resolved by the afternoon. Concurrently, he experienced heaviness in his right leg, making walking difficult. He describes a sensation behind his knee and noted that his leg felt weaker compared to the left. This heaviness improved after resting in the afternoon. He checked his blood pressure yesterday, suspecting it might be low, but it was normal. No vision changes, double vision, or spots in his vision. He occasionally experiences a neuropathy-like sensation in his feet at night, described as a numbing or tingling feeling, which is intermittent and not present every night. He does have family history CVA in his father  He also needs follow up prediabetes, for which he was advised to return for follow-up as he was found to have A1c of 7.0 when checked at his physical in December. Since then he has lost approximately 11.5 pounds since December, reducing  his weight from 226 to 215 pounds.       Medications: Outpatient Medications Prior to Visit  Medication Sig   azelastine  (ASTELIN ) 0.1 % nasal spray Place 1 spray into both nostrils 2 (two) times daily. Use in each nostril as directed   HYDROcodone -acetaminophen  (NORCO/VICODIN) 5-325 MG tablet Take 1 tablet by mouth every 8 (eight) hours.   hydrOXYzine (ATARAX/VISTARIL) 25 MG tablet Take 25 mg by mouth 2 (two) times daily.   tadalafil  (CIALIS ) 20 MG tablet TAKE 1 TABLET BY MOUTH AS  NEEDED NO MORE THAN 1  TABLET IN A DAY   tadalafil  (CIALIS ) 20 MG tablet Take 1 tablet (20 mg total) by mouth daily as needed for erectile dysfunction.   amLODipine  (NORVASC ) 5 MG tablet TAKE 1 TABLET(5 MG) BY MOUTH DAILY   valsartan -hydrochlorothiazide  (DIOVAN -HCT) 160-12.5 MG tablet Take 1 tablet by mouth daily.   No facility-administered medications prior to visit.   Review of Systems     Objective    BP 135/84 (BP Location: Left Arm, Patient Position: Sitting, Cuff Size: Normal)   Pulse 62   Temp 98.1 F (36.7 C) (Oral)   Wt 215 lb (97.5 kg)   SpO2 100%   BMI 31.75 kg/m   Physical Exam    General: Appearance:    Overweight male in no acute distress  Eyes:    PERRL, conjunctiva/corneas clear, EOM's intact       Lungs:  Clear to auscultation bilaterally, respirations unlabored  Heart:    Normal heart rate. Normal rhythm. No murmurs, rubs, or gallops.    MS:   All extremities are intact.    Neurologic:   Awake, alert, oriented x 3. No apparent focal neurological defect. Right DTR's weaker than left.        Assessment & Plan        Possible transient Ischemic Attack (TIA) Symptoms suggestive of TIA due to transient dizziness and unilateral leg heaviness. Ruled out nerve irritation. Emphasized ruling out TIA due to family history of ischemic strokes. - Order ultrasound of the carotid arteries. - Initiate daily baby aspirin  (81 mg). - Order blood work for blood counts and glucose  levels. - Consider MRI to rule out other CNS pathology if carotids are normal and symptoms persist  Neuropathy Intermittent numbness and tingling in feet suggestive of peripheral neuropathy, possibly related to glucose levels. - Monitor glucose levels with blood work. - check vitamin B12, D 25-OH, CMO Bladder Wall Thickening Previous MRI showed bladder wall thickening. Enlarged prostate may contribute. Radiologist not overly concerned but further evaluation needed. - Discuss bladder ultrasound with urologist. - Send MRI images to urologist.  Hyperglycemia Significant lifestyle changes resulted in 11.5-pound weight loss. Acknowledged adherence to dietary recommendations. Check A1c today.   - Continue dietary and lifestyle modifications.         Jeralene Mom, MD  [facility] [phone] (phone) [phone] (fax)  [facility]

## 2024-03-26 ENCOUNTER — Other Ambulatory Visit: Payer: Self-pay

## 2024-03-26 DIAGNOSIS — Z125 Encounter for screening for malignant neoplasm of prostate: Secondary | ICD-10-CM

## 2024-03-27 DIAGNOSIS — Z125 Encounter for screening for malignant neoplasm of prostate: Secondary | ICD-10-CM

## 2024-03-27 DIAGNOSIS — N529 Male erectile dysfunction, unspecified: Secondary | ICD-10-CM

## 2024-03-27 LAB — FPSA% REFLEX
% FREE PSA: 14.6 %
PSA, FREE: 0.98 ng/mL

## 2024-03-27 LAB — PSA TOTAL (REFLEX TO FREE): Prostate Specific Ag, Serum: 6.7 ng/mL — ABNORMAL HIGH (ref 0.0–4.0)

## 2024-03-27 MED ORDER — TADALAFIL 20 MG PO TABS
20.0000 mg | ORAL_TABLET | Freq: Every day | ORAL | 3 refills | Status: DC | PRN
Start: 2024-03-27 — End: 2024-09-02

## 2024-07-29 ENCOUNTER — Encounter: Payer: Self-pay | Admitting: Urology

## 2024-09-02 DIAGNOSIS — N529 Male erectile dysfunction, unspecified: Secondary | ICD-10-CM

## 2024-09-02 MED ORDER — TADALAFIL 20 MG PO TABS
20.0000 mg | ORAL_TABLET | Freq: Every day | ORAL | 6 refills | Status: AC | PRN
Start: 2024-09-02 — End: ?

## 2024-11-16 ENCOUNTER — Encounter: Payer: Self-pay | Admitting: Family Medicine

## 2024-11-16 VITALS — BP 125/86 | HR 63 | Ht 69.0 in | Wt 213.0 lb

## 2024-11-16 DIAGNOSIS — Z87891 Personal history of nicotine dependence: Secondary | ICD-10-CM

## 2024-11-16 DIAGNOSIS — Z23 Encounter for immunization: Secondary | ICD-10-CM

## 2024-11-16 DIAGNOSIS — I10 Essential (primary) hypertension: Secondary | ICD-10-CM

## 2024-11-16 DIAGNOSIS — N401 Enlarged prostate with lower urinary tract symptoms: Secondary | ICD-10-CM

## 2024-11-16 DIAGNOSIS — Z860101 Personal history of adenomatous and serrated colon polyps: Secondary | ICD-10-CM

## 2024-11-16 DIAGNOSIS — Z0000 Encounter for general adult medical examination without abnormal findings: Secondary | ICD-10-CM

## 2024-11-16 DIAGNOSIS — G4733 Obstructive sleep apnea (adult) (pediatric): Secondary | ICD-10-CM

## 2024-11-16 DIAGNOSIS — Z0001 Encounter for general adult medical examination with abnormal findings: Secondary | ICD-10-CM

## 2024-11-16 NOTE — Patient Instructions (Signed)
 Please review the attached list of medications and notify my office if there are any errors.   We'll plan on placing a referral for the Lung Scan and colonoscopy in April.

## 2024-11-16 NOTE — Progress Notes (Signed)
 "    Complete physical exam   Patient: Jerry Walsh   DOB: [birth year]   63 y.o. Male  MRN: [id number] Visit Date: 11/16/2024  Today's healthcare provider: Nancyann Perry, MD   Chief Complaint  Patient presents with   Annual Exam    Last completed 11/15/23 Diet - Healthy Exercise - 3-4 times a week for at least 40 minutes Feeling - well Sleeping - well Concerns - none    Subjective    Discussed the use of AI scribe software for clinical note transcription with the patient, who gave verbal consent to proceed.  History of Present Illness   Jerry Walsh is a 63 year old male who presents for vaccinations in preparation for spending time with his newborn grandchild.  He is seeking vaccinations, including the flu shot and pneumonia vaccine. His first grandchild is due in February.  advised by his daughter before spending time with his soon-to-be-born grandchild. He is currently between jobs. He plans to start a new job soon, which will provide insurance coverage.  He reports a recent neck injury sustained while exercising, specifically during crunches, resulting in stiffness. He sought treatment at [facility], where he received a steroid pack and a muscle relaxer, which improved his condition. He has since adjusted his exercise routine to avoid further injury.  He maintains a regular exercise regimen, including cycling on a Peloton and jump roping, and feels he is keeping his weight in check. He has a history of smoking but quit for at least five or six years, motivated by the health risks and the recent death of a friend from lung cancer.  He is currently taking amlodipine  and valsartan  for blood pressure management. His blood pressure was recently measured at 167/80, but it typically decreases after resting. He mentions needing refills for his medications.  He continues to see a neurologist who monitors his PSA levels annually. The last check was in May 2025. He plans to  schedule his next appointment soon.  No current respiratory issues reported. He feels generally healthy aside from the recent neck injury.      HPI     Annual Exam    Additional comments: Last completed 11/15/23 Diet - Healthy Exercise - 3-4 times a week for at least 40 minutes Feeling - well Sleeping - well Concerns - none       Last edited by Lilian Fitzpatrick, CMA on 11/16/2024  4:04 PM.       Past Medical History:  Diagnosis Date   ED (erectile dysfunction) of organic origin 04/25/2006   Elevated PSA 08/10/2015   Essential (primary) hypertension 05/18/2008   Fam hx-ischem heart disease 05/18/2008   Hyperglycemia 08/10/2015   Insomnia due to medical condition 04/25/2006   OSA on CPAP 02/25/2007   Severe sleep apnea, AHI=96, desaturation to 71% on split night 03-06-2007. On CPAP   Scalp cyst 05/01/2017   Sleep apnea    Tobacco abuse 05/18/2015   Past Surgical History:  Procedure Laterality Date   COLONOSCOPY WITH PROPOFOL  N/A 08/14/2019   Procedure: COLONOSCOPY WITH PROPOFOL ;  Surgeon: Jinny Carmine, MD;  Location: [facility];  Service: Endoscopy;  Laterality: N/A;  sleep apnea   DENTAL SURGERY  06/2014   POLYPECTOMY  08/14/2019   Procedure: POLYPECTOMY;  Surgeon: Jinny Carmine, MD;  Location: [facility];  Service: Endoscopy;;   WISDOM TOOTH EXTRACTION  1989   Social History   Socioeconomic History   Marital status: Married    Spouse  name: Not on file   Number of children: Not on file   Years of education: Not on file   Highest education level: Bachelor's degree (e.g., BA, AB, BS)  Occupational History   Not on file  Tobacco Use   Smoking status: Former    Current packs/day: 0.00    Average packs/day: 0.8 packs/day for 36.0 years (27.0 ttl pk-yrs)    Types: Cigarettes    Start date: 35    Quit date: 11/30/2019    Years since quitting: 4.9   Smokeless tobacco: Never   Tobacco comments:    I quit in January of 2021  Vaping Use   Vaping  status: Never Used  Substance and Sexual Activity   Alcohol use: Yes    Alcohol/week: 10.0 standard drinks of alcohol    Types: 4 Cans of beer, 4 Shots of liquor, 2 Standard drinks or equivalent per week    Comment: MODERATE USE   Drug use: No   Sexual activity: Yes    Birth control/protection: None  Other Topics Concern   Not on file  Social History Narrative   Not on file   Social Drivers of Health   Tobacco Use: Medium Risk (11/16/2024)   Patient History    Smoking Tobacco Use: Former    Smokeless Tobacco Use: Never    Passive Exposure: Not on file  Financial Resource Strain: Patient Declined (11/14/2023)   Overall Financial Resource Strain (CARDIA)    Difficulty of Paying Living Expenses: Patient declined  Food Insecurity: No Food Insecurity (11/14/2023)   Hunger Vital Sign    Worried About Running Out of Food in the Last Year: Never true    Ran Out of Food in the Last Year: Never true  Transportation Needs: No Transportation Needs (11/14/2023)   PRAPARE - Administrator, Civil Service (Medical): No    Lack of Transportation (Non-Medical): No  Physical Activity: Unknown (11/14/2023)   Exercise Vital Sign    Days of Exercise per Week: Patient declined    Minutes of Exercise per Session: Not on file  Stress: Stress Concern Present (11/14/2023)   [employer] of Occupational Health - Occupational Stress Questionnaire    Feeling of Stress : To some extent  Social Connections: Moderately Integrated (11/14/2023)   Social Connection and Isolation Panel    Frequency of Communication with Friends and Family: More than three times a week    Frequency of Social Gatherings with Friends and Family: Once a week    Attends Religious Services: More than 4 times per year    Active Member of [employer] or Organizations: No    Attends Engineer, Structural: Not on file    Marital Status: Married  Catering Manager Violence: Not on file  Depression (PHQ2-9): Low Risk  (02/26/2024)   Depression (PHQ2-9)    PHQ-2 Score: 0  Alcohol Screen: Low Risk (11/14/2023)   Alcohol Screen    Last Alcohol Screening Score (AUDIT): 5  Housing: Unknown (11/14/2023)   Housing Stability Vital Sign    Unable to Pay for Housing in the Last Year: No    Number of Times Moved in the Last Year: Not on file    Homeless in the Last Year: No  Utilities: Not on file  Health Literacy: Not on file   Family Status  Relation Name Status   Mother Arabella Deceased   Father Lethaniel Deceased   Brother 1 Alive   Brother 2 Deceased  No partnership data on  file   Family History  Problem Relation Age of Onset   Diabetes Mother    Congestive Heart Failure Mother    Heart disease Father    Coronary artery disease Father    Diabetes Father    Stroke Father    Allergies[1]  Patient Care Team: Gasper Nancyann BRAVO, MD as PCP - General (Family Medicine) Francisca Redell BROCKS, MD as Consulting Physician (Urology)   Medications: Show/hide medication list[2]  Review of Systems    Objective    BP 125/86 (BP Location: Right Arm, Patient Position: Sitting, Cuff Size: Large)   Pulse 63   Ht 5' 9 (1.753 m)   Wt 213 lb (96.6 kg)   SpO2 98%   BMI 31.45 kg/m    Physical Exam   General Appearance:    Mildly obese male. Alert, cooperative, in no acute distress, appears stated age  Head:    Normocephalic, without obvious abnormality, atraumatic  Eyes:    PERRL, conjunctiva/corneas clear, EOM's intact, fundi    benign, both eyes       Ears:    Normal TM's and external ear canals, both ears  Nose:   Nares normal, septum midline, mucosa normal, no drainage   or sinus tenderness  Throat:   Lips, mucosa, and tongue normal; teeth and gums normal  Neck:   Supple, symmetrical, trachea midline, no adenopathy;       thyroid :  No enlargement/tenderness/nodules; no carotid   bruit or JVD  Back:     Symmetric, no curvature, ROM normal, no CVA tenderness  Lungs:     Clear to auscultation bilaterally,  respirations unlabored  Chest wall:    No tenderness or deformity  Heart:    Normal heart rate. Normal rhythm. No murmurs, rubs, or gallops.  S1 and S2 normal  Abdomen:     Soft, non-tender, bowel sounds active all four quadrants,    no masses, no organomegaly  Genitalia:    deferred  Rectal:    deferred  Extremities:   All extremities are intact. No cyanosis or edema  Pulses:   2+ and symmetric all extremities  Skin:   Skin color, texture, turgor normal, no rashes or lesions  Lymph nodes:   Cervical, supraclavicular, and axillary nodes normal  Neurologic:   CNII-XII intact. Normal strength, sensation and reflexes      throughout       Last depression screening scores    02/26/2024    8:49 AM 11/15/2023    1:21 PM 12/25/2022    2:44 PM  PHQ 2/9 Scores  PHQ - 2 Score 0 0 0  PHQ- 9 Score 0  0  0      Data saved with a previous flowsheet row definition   Last fall risk screening    02/26/2024    8:49 AM  Fall Risk   Falls in the past year? 0  Number falls in past yr: 0  Injury with Fall? 0      Data saved with a previous flowsheet row definition   Last Audit-C alcohol use screening    11/14/2023    7:20 PM  Alcohol Use Disorder Test (AUDIT)  1. How often do you have a drink containing alcohol? 2   2. How many drinks containing alcohol do you have on a typical day when you are drinking? 2   3. How often do you have six or more drinks on one occasion? 1   AUDIT-C Score 5   4. How  often during the last year have you found that you were not able to stop drinking once you had started? 0   5. How often during the last year have you failed to do what was normally expected from you because of drinking? 0   6. How often during the last year have you needed a first drink in the morning to get yourself going after a heavy drinking session? 0   7. How often during the last year have you had a feeling of guilt of remorse after drinking? 0   8. How often during the last year have you  been unable to remember what happened the night before because you had been drinking? 0   9. Have you or someone else been injured as a result of your drinking? 0   10. Has a relative or friend or a doctor or another health worker been concerned about your drinking or suggested you cut down? 0   Alcohol Use Disorder Identification Test Final Score (AUDIT) 5      Manually entered by patient   A score of 3 or more in women, and 4 or more in men indicates increased risk for alcohol abuse, EXCEPT if all of the points are from question 1   No results found for any visits on 11/16/24.  Assessment & Plan    Routine Health Maintenance and Physical Exam  Exercise Activities and Dietary recommendations  Goals   None     Immunization History  Administered Date(s) Administered   Influenza, Seasonal, Injecte, Preservative Fre 11/16/2024   Influenza,inj,Quad PF,6+ Mos 09/09/2020   Influenza-Unspecified 09/07/2021   Moderna Covid-19 Vaccine Bivalent Booster 31yrs & up 10/12/2021   PFIZER(Purple Top)SARS-COV-2 Vaccination 02/04/2020, 03/03/2020, 10/05/2020   PNEUMOCOCCAL CONJUGATE-20 11/16/2024   Tdap 05/18/2008, 07/04/2010, 11/15/2023   Zoster Recombinant(Shingrix) 06/10/2020, 08/26/2020    Health Maintenance  Topic Date Due   Lung Cancer Screening  Never done   COVID-19 Vaccine (5 - 2025-26 season) 07/27/2024   Colonoscopy  08/13/2024   DTaP/Tdap/Td (4 - Td or Tdap) 11/14/2033   Pneumococcal Vaccine: 50+ Years  Completed   Influenza Vaccine  Completed   Hepatitis C Screening  Completed   HIV Screening  Completed   Zoster Vaccines- Shingrix  Completed   Hepatitis B Vaccines 19-59 Average Risk  Aged Out   HPV VACCINES  Aged Out   Meningococcal B Vaccine  Aged Out    Discussed health benefits of physical activity, and encouraged him to engage in regular exercise appropriate for his age and condition.     Essential hypertension Blood pressure controlled with current medications.  Recent reading 167/80, typically decreases with relaxation. - Refilled amlodipine  prescription. - Refilled valsartan  prescription.  General Health Maintenance Due for routine screenings and vaccinations. Discussed lung cancer screening importance due to smoking history and friend's recent death. - Schedule lung cancer screening after new insurance is active. - Administer pneumonia and flu vaccinations. - Ordered blood work for routine health maintenance.      Vaccines needed  - Flu vaccine trivalent PF, 6mos and older(Flulaval,Afluria,Fluarix,Fluzone) - Pneumococcal conjugate vaccine 20-valent  History of adenomatous polyp of colon Will setup up referral for colonoscopy after the new year.   Stopped smoking with greater than 20 pack year history Will refer lung cancer screening after the new year.   OSA on CPAP  Benign prostatic hyperplasia with lower urinary tract symptoms, symptom details unspecified  Continue regular follow up Dr. Etheleen  Nancyann Perry, MD  [facility] [phone] (phone) [phone] (fax)  [facility]    [1]  Allergies Allergen Reactions   Bupropion  Itching  [2]  Outpatient Medications Prior to Visit  Medication Sig   amLODipine  (NORVASC ) 5 MG tablet Take 1 tablet (5 mg total) by mouth daily.   aspirin  EC 81 MG tablet Take 1 tablet (81 mg total) by mouth daily. Swallow whole.   azelastine  (ASTELIN ) 0.1 % nasal spray Place 1 spray into both nostrils 2 (two) times daily. Use in each nostril as directed   HYDROcodone -acetaminophen  (NORCO/VICODIN) 5-325 MG tablet Take 1 tablet by mouth every 8 (eight) hours.   hydrOXYzine (ATARAX/VISTARIL) 25 MG tablet Take 25 mg by mouth 2 (two) times daily.   tadalafil  (CIALIS ) 20 MG tablet Take 1 tablet (20 mg total) by mouth daily as needed for erectile dysfunction.   valsartan -hydrochlorothiazide  (DIOVAN -HCT) 160-12.5 MG tablet Take 1 tablet by mouth daily.    No facility-administered medications prior to visit.   "

## 2024-11-18 ENCOUNTER — Ambulatory Visit: Payer: Self-pay | Admitting: Family Medicine

## 2024-11-18 LAB — LIPID PANEL
Chol/HDL Ratio: 3.8 ratio (ref 0.0–5.0)
Cholesterol, Total: 215 mg/dL — ABNORMAL HIGH (ref 100–199)
HDL: 56 mg/dL
LDL Chol Calc (NIH): 137 mg/dL — ABNORMAL HIGH (ref 0–99)
Triglycerides: 123 mg/dL (ref 0–149)
VLDL Cholesterol Cal: 22 mg/dL (ref 5–40)

## 2024-11-18 LAB — COMPREHENSIVE METABOLIC PANEL WITH GFR
ALT: 31 IU/L (ref 0–44)
AST: 30 IU/L (ref 0–40)
Albumin: 4.4 g/dL (ref 3.9–4.9)
Alkaline Phosphatase: 79 IU/L (ref 47–123)
BUN/Creatinine Ratio: 12 (ref 10–24)
BUN: 17 mg/dL (ref 8–27)
Bilirubin Total: 0.8 mg/dL (ref 0.0–1.2)
CO2: 25 mmol/L (ref 20–29)
Calcium: 9.5 mg/dL (ref 8.6–10.2)
Chloride: 102 mmol/L (ref 96–106)
Creatinine, Ser: 1.43 mg/dL — ABNORMAL HIGH (ref 0.76–1.27)
Globulin, Total: 2.2 g/dL (ref 1.5–4.5)
Glucose: 126 mg/dL — ABNORMAL HIGH (ref 70–99)
Potassium: 4.3 mmol/L (ref 3.5–5.2)
Sodium: 142 mmol/L (ref 134–144)
Total Protein: 6.6 g/dL (ref 6.0–8.5)
eGFR: 55 mL/min/1.73 — ABNORMAL LOW

## 2024-12-18 ENCOUNTER — Other Ambulatory Visit: Payer: Self-pay | Admitting: Family Medicine

## 2024-12-18 DIAGNOSIS — I10 Essential (primary) hypertension: Secondary | ICD-10-CM

## 2025-03-18 ENCOUNTER — Other Ambulatory Visit

## 2025-03-24 ENCOUNTER — Ambulatory Visit: Admitting: Urology

## 2025-03-25 ENCOUNTER — Ambulatory Visit: Admitting: Urology
